# Patient Record
Sex: Female | Born: 1970 | Race: White | Hispanic: No | Smoking: Former smoker
Health system: Southern US, Community
[De-identification: ages and names within clinical notes are randomized; demographics above are authoritative.]

## PROBLEM LIST (undated history)

## (undated) DIAGNOSIS — F329 Major depressive disorder, single episode, unspecified: Secondary | ICD-10-CM

## (undated) DIAGNOSIS — F32A Depression, unspecified: Secondary | ICD-10-CM

## (undated) DIAGNOSIS — IMO0002 Reserved for concepts with insufficient information to code with codable children: Secondary | ICD-10-CM

## (undated) DIAGNOSIS — F419 Anxiety disorder, unspecified: Secondary | ICD-10-CM

## (undated) DIAGNOSIS — R519 Headache, unspecified: Secondary | ICD-10-CM

## (undated) DIAGNOSIS — R87619 Unspecified abnormal cytological findings in specimens from cervix uteri: Secondary | ICD-10-CM

## (undated) DIAGNOSIS — Q512 Other doubling of uterus, unspecified: Secondary | ICD-10-CM

## (undated) DIAGNOSIS — R51 Headache: Secondary | ICD-10-CM

## (undated) HISTORY — PX: OTHER SURGICAL HISTORY: SHX169

## (undated) HISTORY — PX: HIP SURGERY: SHX245

## (undated) HISTORY — DX: Other doubling of uterus, unspecified: Q51.20

## (undated) HISTORY — PX: ABDOMINAL SURGERY: SHX537

## (undated) HISTORY — PX: APPENDECTOMY: SHX54

## (undated) HISTORY — PX: CRYO INTERCOSTAL NERVE BLOCK: SHX6522

## (undated) HISTORY — PX: CRYOABLATION: SHX1415

## (undated) HISTORY — DX: Unspecified abnormal cytological findings in specimens from cervix uteri: R87.619

## (undated) HISTORY — PX: TUBAL LIGATION: SHX77

## (undated) HISTORY — PX: BREAST ENHANCEMENT SURGERY: SHX7

---

## 1998-01-20 HISTORY — PX: AUGMENTATION MAMMAPLASTY: SUR837

## 2009-06-28 ENCOUNTER — Ambulatory Visit: Payer: Self-pay

## 2010-09-04 ENCOUNTER — Ambulatory Visit: Payer: Self-pay

## 2014-12-11 ENCOUNTER — Other Ambulatory Visit: Payer: Self-pay

## 2014-12-11 ENCOUNTER — Encounter: Payer: Self-pay | Admitting: *Deleted

## 2014-12-11 NOTE — Patient Instructions (Signed)
  Your procedure is scheduled on: 12-22-14 ( Friday) Report to [facility] 2ND FLOOR. To find out your arrival time please call [phone] between 1PM - 3PM on 12-21-14 [facility](THURSDAY)  Remember: Instructions that are not followed completely may result in serious medical risk, up to and including death, or upon the discretion of your surgeon and anesthesiologist your surgery may need to be rescheduled.    _X___ 1. Do not eat food or drink liquids after midnight. No gum chewing or hard candies.     _X___ 2. No Alcohol for 24 hours before or after surgery.   ____ 3. Bring all medications with you on the day of surgery if instructed.    _X___ 4. Notify your doctor if there is any change in your medical condition     (cold, fever, infections).     Do not wear jewelry, make-up, hairpins, clips or nail polish.  Do not wear lotions, powders, or perfumes. You may wear deodorant.  Do not shave 48 hours prior to surgery. Men may shave face and neck.  Do not bring valuables to the hospital.    [facility] any belongings or valuables.               Contacts, dentures or bridgework may not be worn into surgery.  Leave your suitcase in the car. After surgery it may be brought to your room.  For patients admitted to the hospital, discharge time is determined by your treatment team.   Patients discharged the day of surgery will not be allowed to drive home.   Please read over the following fact sheets that you were given:      ____ Take these medicines the morning of surgery with A SIP OF WATER:    1. NONE  2.   3.   4.  5.  6.  ____ Fleet Enema (as directed)   ____ Use CHG Soap as directed  ____ Use inhalers on the day of surgery  ____ Stop metformin 2 days prior to surgery    ____ Take 1/2 of usual insulin dose the night before surgery and none on the morning of surgery.   ____ Stop Coumadin/Plavix/aspirin-[name]  ____ Stop  Anti-inflammatories-NO NSAIDS OR ASA PRODUCTS-TYLENOL OK   ____ Stop supplements until after surgery.    ____ Bring C-Pap to the hospital.

## 2014-12-12 ENCOUNTER — Inpatient Hospital Stay: Admission: RE | Admit: 2014-12-12 | Payer: Self-pay | Source: Ambulatory Visit

## 2014-12-13 ENCOUNTER — Encounter
Admission: RE | Admit: 2014-12-13 | Discharge: 2014-12-13 | Disposition: A | Discharge status: Home / Self Care | Payer: BLUE CROSS/BLUE SHIELD | Source: Ambulatory Visit | Attending: Anesthesiology | Admitting: Anesthesiology

## 2014-12-13 DIAGNOSIS — Z01812 Encounter for preprocedural laboratory examination: Secondary | ICD-10-CM | POA: Diagnosis present

## 2014-12-13 LAB — POTASSIUM: POTASSIUM: 4.8 mmol/L (ref 3.5–5.1)

## 2014-12-21 ENCOUNTER — Encounter: Payer: Self-pay | Admitting: *Deleted

## 2014-12-22 ENCOUNTER — Encounter: Payer: Self-pay | Admitting: *Deleted

## 2014-12-22 ENCOUNTER — Ambulatory Visit
Admission: RE | Admit: 2014-12-22 | Discharge: 2014-12-22 | Disposition: A | Discharge status: Home / Self Care | Payer: BLUE CROSS/BLUE SHIELD | Source: Ambulatory Visit | Attending: Obstetrics and Gynecology | Admitting: Obstetrics and Gynecology

## 2014-12-22 ENCOUNTER — Encounter
Admission: RE | Disposition: A | Discharge status: Home / Self Care | Payer: Self-pay | Source: Ambulatory Visit | Attending: Obstetrics and Gynecology

## 2014-12-22 ENCOUNTER — Ambulatory Visit: Payer: BLUE CROSS/BLUE SHIELD | Admitting: Certified Registered Nurse Anesthetist

## 2014-12-22 DIAGNOSIS — Z793 Long term (current) use of hormonal contraceptives: Secondary | ICD-10-CM | POA: Diagnosis not present

## 2014-12-22 DIAGNOSIS — F419 Anxiety disorder, unspecified: Secondary | ICD-10-CM | POA: Insufficient documentation

## 2014-12-22 DIAGNOSIS — Z87891 Personal history of nicotine dependence: Secondary | ICD-10-CM | POA: Insufficient documentation

## 2014-12-22 DIAGNOSIS — N841 Polyp of cervix uteri: Secondary | ICD-10-CM | POA: Insufficient documentation

## 2014-12-22 DIAGNOSIS — F329 Major depressive disorder, single episode, unspecified: Secondary | ICD-10-CM | POA: Insufficient documentation

## 2014-12-22 DIAGNOSIS — Z96641 Presence of right artificial hip joint: Secondary | ICD-10-CM | POA: Diagnosis not present

## 2014-12-22 DIAGNOSIS — N854 Malposition of uterus: Secondary | ICD-10-CM | POA: Diagnosis not present

## 2014-12-22 DIAGNOSIS — Z9049 Acquired absence of other specified parts of digestive tract: Secondary | ICD-10-CM | POA: Diagnosis not present

## 2014-12-22 DIAGNOSIS — Q5128 Other doubling of uterus, other specified: Secondary | ICD-10-CM

## 2014-12-22 DIAGNOSIS — Q512 Other doubling of uterus: Secondary | ICD-10-CM | POA: Insufficient documentation

## 2014-12-22 DIAGNOSIS — Z79899 Other long term (current) drug therapy: Secondary | ICD-10-CM | POA: Insufficient documentation

## 2014-12-22 DIAGNOSIS — N920 Excessive and frequent menstruation with regular cycle: Secondary | ICD-10-CM | POA: Insufficient documentation

## 2014-12-22 DIAGNOSIS — Z9851 Tubal ligation status: Secondary | ICD-10-CM | POA: Insufficient documentation

## 2014-12-22 HISTORY — DX: Headache, unspecified: R51.9

## 2014-12-22 HISTORY — DX: Other and unspecified doubling of uterus: Q51.28

## 2014-12-22 HISTORY — DX: Anxiety disorder, unspecified: F41.9

## 2014-12-22 HISTORY — DX: Headache: R51

## 2014-12-22 HISTORY — PX: HYSTEROSCOPY WITH D & C: SHX1775

## 2014-12-22 HISTORY — DX: Major depressive disorder, single episode, unspecified: F32.9

## 2014-12-22 HISTORY — DX: Depression, unspecified: F32.A

## 2014-12-22 HISTORY — DX: Reserved for concepts with insufficient information to code with codable children: IMO0002

## 2014-12-22 LAB — POCT PREGNANCY, URINE: Preg Test, Ur: NEGATIVE

## 2014-12-22 SURGERY — DILATATION AND CURETTAGE /HYSTEROSCOPY
Anesthesia: General | Wound class: Clean Contaminated

## 2014-12-22 MED ORDER — ONDANSETRON HCL 4 MG/2ML IJ SOLN
INTRAMUSCULAR | Status: DC | PRN
  Administered 2014-12-22: 4 mg via INTRAVENOUS

## 2014-12-22 MED ORDER — FAMOTIDINE 20 MG PO TABS
20.0000 mg | ORAL_TABLET | Freq: Once | ORAL | Status: AC
  Administered 2014-12-22: 20 mg via ORAL

## 2014-12-22 MED ORDER — MIDAZOLAM HCL 2 MG/2ML IJ SOLN
INTRAMUSCULAR | Status: DC | PRN
  Administered 2014-12-22: 2 mg via INTRAVENOUS

## 2014-12-22 MED ORDER — PROPOFOL 10 MG/ML IV BOLUS
INTRAVENOUS | Status: DC | PRN
  Administered 2014-12-22: 150 mg via INTRAVENOUS

## 2014-12-22 MED ORDER — DEXAMETHASONE SODIUM PHOSPHATE 4 MG/ML IJ SOLN
INTRAMUSCULAR | Status: DC | PRN
  Administered 2014-12-22: 5 mg via INTRAVENOUS

## 2014-12-22 MED ORDER — FENTANYL CITRATE (PF) 100 MCG/2ML IJ SOLN
INTRAMUSCULAR | Status: DC | PRN
  Administered 2014-12-22 (×2): 50 ug via INTRAVENOUS

## 2014-12-22 MED ORDER — FENTANYL CITRATE (PF) 100 MCG/2ML IJ SOLN
25.0000 ug | INTRAMUSCULAR | Status: AC | PRN
  Administered 2014-12-22 (×6): 25 ug via INTRAVENOUS

## 2014-12-22 MED ORDER — HYDROCODONE-ACETAMINOPHEN 5-325 MG PO TABS: 1.0000 | ORAL_TABLET | Freq: Four times a day (QID) | ORAL | Status: DC | PRN

## 2014-12-22 MED ORDER — ACETAMINOPHEN 10 MG/ML IV SOLN
INTRAVENOUS | Status: DC | PRN
  Administered 2014-12-22: 1000 mg via INTRAVENOUS

## 2014-12-22 MED ORDER — FAMOTIDINE 20 MG PO TABS
ORAL_TABLET | ORAL | Status: AC
  Filled 2014-12-22: qty 1

## 2014-12-22 MED ORDER — OXYCODONE HCL 5 MG PO TABS
ORAL_TABLET | ORAL | Status: AC
  Filled 2014-12-22: qty 1

## 2014-12-22 MED ORDER — OXYCODONE HCL 5 MG/5ML PO SOLN: 5.0000 mg | Freq: Once | ORAL | Status: AC | PRN

## 2014-12-22 MED ORDER — FENTANYL CITRATE (PF) 100 MCG/2ML IJ SOLN
INTRAMUSCULAR | Status: AC
  Administered 2014-12-22: 25 ug via INTRAVENOUS
  Filled 2014-12-22: qty 2

## 2014-12-22 MED ORDER — ACETAMINOPHEN 10 MG/ML IV SOLN
INTRAVENOUS | Status: AC
  Filled 2014-12-22: qty 100

## 2014-12-22 MED ORDER — LIDOCAINE HCL (CARDIAC) 20 MG/ML IV SOLN
INTRAVENOUS | Status: DC | PRN
  Administered 2014-12-22: 100 mg via INTRAVENOUS

## 2014-12-22 MED ORDER — IBUPROFEN 600 MG PO TABS: 600.0000 mg | ORAL_TABLET | Freq: Four times a day (QID) | ORAL | Status: DC | PRN

## 2014-12-22 MED ORDER — LACTATED RINGERS IV SOLN
INTRAVENOUS | Status: DC
  Administered 2014-12-22: 14:00:00 via INTRAVENOUS

## 2014-12-22 MED ORDER — SODIUM CHLORIDE 0.9 % IR SOLN
Status: DC | PRN
  Administered 2014-12-22: 350 mL

## 2014-12-22 MED ORDER — OXYCODONE HCL 5 MG PO TABS
5.0000 mg | ORAL_TABLET | Freq: Once | ORAL | Status: AC | PRN
  Administered 2014-12-22: 5 mg via ORAL

## 2014-12-22 SURGICAL SUPPLY — 21 items
ABLATOR ENDOMETRIAL MYOSURE (ABLATOR) ×3 IMPLANT
CANISTER SUC SOCK COL 7IN (MISCELLANEOUS) ×3 IMPLANT
CATH ROBINSON RED A/P 16FR (CATHETERS) ×3 IMPLANT
GLOVE BIO SURGEON STRL SZ7 (GLOVE) ×12 IMPLANT
GOWN STRL REUS W/ TWL LRG LVL3 (GOWN DISPOSABLE) ×4 IMPLANT
GOWN STRL REUS W/TWL LRG LVL3 (GOWN DISPOSABLE) ×2
IV LACTATED RINGERS 1000ML (IV SOLUTION) ×3 IMPLANT
KIT RM TURNOVER CYSTO AR (KITS) ×3 IMPLANT
MYOSURE LITE POLYP REMOVAL (MISCELLANEOUS) IMPLANT
NOVASURE ENDOMETRIAL ABLATION (MISCELLANEOUS) ×3 IMPLANT
NS IRRIG 500ML POUR BTL (IV SOLUTION) ×3 IMPLANT
PACK DNC HYST (MISCELLANEOUS) ×3 IMPLANT
PAD GROUND ADULT SPLIT (MISCELLANEOUS) ×3 IMPLANT
PAD OB MATERNITY 4.3X12.25 (PERSONAL CARE ITEMS) ×3 IMPLANT
PAD PREP 24X41 OB/GYN DISP (PERSONAL CARE ITEMS) ×3 IMPLANT
SEAL ROD LENS SCOPE MYOSURE (ABLATOR) ×3 IMPLANT
SOL .9 NS 3000ML IRR  AL (IV SOLUTION) ×2
SOL .9 NS 3000ML IRR UROMATIC (IV SOLUTION) ×4 IMPLANT
TOWEL OR 17X26 4PK STRL BLUE (TOWEL DISPOSABLE) ×3 IMPLANT
TUBING CONNECTING 10 (TUBING) ×3 IMPLANT
TUBING HYSTEROSCOPY DOLPHIN (MISCELLANEOUS) ×3 IMPLANT

## 2014-12-22 NOTE — Op Note (Signed)
Patient Name: Brenda Stuart Date of Procedure: @TODAY @  Preoperative Diagnosis: 1) 44 y.o. with menorrhagia 2) Cervical polyp  Postoperative Diagnosis: 1) 44 y.o. with menorrhagia 2) Cervical polyp 3) Uterine septum  Operation Performed: Hysteroscopy, cervical polypectomy  Indication: Patient over 40 on combination OCP for menorrhagia, prior BTL, wanting to discontinue combination OCP and removal of cervical polyp.  Preoperative endometrial biopsy normal and normal pap.  Anesthesia: General  Primary Surgeon: Vena AustriaAndreas Janziel Hockett, MD  Assistant: none  Preoperative Antibiotics: none  Estimated Blood Loss: 5mL  IV Fluids: 600mL  Urine Output:: ~2925mL straiggt cath  Drains or Tubes: none  Implants: none  Specimens Removed: endocervical curetting and cervical polyp  Complications: none  Intraoperative Findings:  Cervical polyp, uterine septum precluding novasure ablation  Patient Condition: stable  Procedure in Detail:  Patient was taken to the operating room were she was administered general endotracheal anesthesia.  She was positioned in the dorsal lithotomy position utilizing Allen stirups, prepped and draped in the usual sterile fashion.  Uterus was noted to be anteverted and normal in size.   Prior to proceeding with the case a time out was performed.  Attention was turned to the patient's pelvis.  A red rubber catheter was used to empty the patient's bladder.  An operative speculum was placed to allow visualization of the cervix.  The anterior lip of the cervix was grasped with a single tooth tenaculum and the cervix was sequentially dilated using pratt dilators, sounding length was 8cm.  The hysteroscope was then advanced into the uterine cavity noting the above findings.  Given finding of uterine septum novasure endometrial ablation was unable to be performed.  Endocervical curettage was performed and the resulting specimen collected and sent to pathology.    The  single tooth tenaculum was removed from the cervix.  The tenaculum sites and cervix were noted to be hemostatic before removing the operative speculum.  Sponge needle and instrument counts were corrects times two.  The patient tolerated the procedure well and was taken to the recovery room in stable condition.

## 2014-12-22 NOTE — H&P (Signed)
Initial H&P reviewed   History reviewed, patient examined, no change in status, stable for surgery.  

## 2014-12-22 NOTE — Anesthesia Preprocedure Evaluation (Signed)
Anesthesia Evaluation  Patient identified by MRN, date of birth, ID band Patient awake    Reviewed: Allergy & Precautions, H&P , NPO status , Patient's Chart, lab work & pertinent test results  History of Anesthesia Complications Negative for: history of anesthetic complications  Airway Mallampati: III  TM Distance: >3 FB Neck ROM: full    Dental no notable dental hx. (+) Teeth Intact   Pulmonary neg shortness of breath, former smoker,    Pulmonary exam normal breath sounds clear to auscultation       Cardiovascular Exercise Tolerance: Good (-) angina(-) Past MI and (-) DOE negative cardio ROS Normal cardiovascular exam Rhythm:regular Rate:Normal     Neuro/Psych  Headaches, PSYCHIATRIC DISORDERS Anxiety Depression  Neuromuscular disease    GI/Hepatic negative GI ROS, Neg liver ROS,   Endo/Other  negative endocrine ROS  Renal/GU negative Renal ROS  negative genitourinary   Musculoskeletal   Abdominal   Peds  Hematology negative hematology ROS (+)   Anesthesia Other Findings Past Medical History:   Complex regional pain syndrome                               Headache                                                       Comment:MIGRAINES    Anxiety                                                      Depression                                                  Past Surgical History:   ABDOMINAL SURGERY                                             APPENDECTOMY                                                  BREAST ENHANCEMENT SURGERY                                    CESAREAN SECTION                                              CRYOABLATION  HIP SURGERY                                                   TUBAL LIGATION                                               BMI    Body Mass Index   20.37 kg/m 2      Reproductive/Obstetrics negative OB ROS                              Anesthesia Physical Anesthesia Plan  ASA: III  Anesthesia Plan: General LMA   Post-op Pain Management:    Induction:   Airway Management Planned:   Additional Equipment:   Intra-op Plan:   Post-operative Plan:   Informed Consent: I have reviewed the patients History and Physical, chart, labs and discussed the procedure including the risks, benefits and alternatives for the proposed anesthesia with the patient or authorized representative who has indicated his/her understanding and acceptance.   Dental Advisory Given  Plan Discussed with: Anesthesiologist, CRNA and Surgeon  Anesthesia Plan Comments:         Anesthesia Quick Evaluation

## 2014-12-22 NOTE — Anesthesia Postprocedure Evaluation (Signed)
Anesthesia Post Note  Patient: Brenda Stuart  Procedure(s) Performed: Procedure(s): DILATATION AND CURETTAGE /HYSTEROSCOPY with cervical polypectomy  Patient location during evaluation: PACU Anesthesia Type: General Level of consciousness: awake and alert Pain management: pain level controlled Vital Signs Assessment: post-procedure vital signs reviewed and stable Respiratory status: spontaneous breathing, nonlabored ventilation, respiratory function stable and patient connected to nasal cannula oxygen Cardiovascular status: blood pressure returned to baseline and stable Postop Assessment: no signs of nausea or vomiting Anesthetic complications: no    Last Vitals:  Filed Vitals:   12/22/14 1557 12/22/14 1646  BP: 111/70 114/78  Pulse: 72 72  Temp: 36.8 C 36.7 C  Resp: 14 16    Last Pain:  Filed Vitals:   12/22/14 1647  PainSc: 4                  Jomarie LongsJoseph K Piscitello

## 2014-12-22 NOTE — Transfer of Care (Signed)
Immediate Anesthesia Transfer of Care Note  Patient: Brenda Stuart  Procedure(s) Performed: Procedure(s): DILATATION AND CURETTAGE /HYSTEROSCOPY  Patient Location: PACU  Anesthesia Type:General  Level of Consciousness: awake and alert   Airway & Oxygen Therapy: Patient Spontanous Breathing and Patient connected to nasal cannula oxygen  Post-op Assessment: Report given to RN and Post -op Vital signs reviewed and stable  Post vital signs: Reviewed and stable  Last Vitals:  Filed Vitals:   12/22/14 1304 12/22/14 1502  BP: 108/74 124/85  Pulse: 71 81  Temp: 36.5 C 36.7 C  Resp: 16 20    Complications: No apparent anesthesia complications

## 2014-12-22 NOTE — Anesthesia Procedure Notes (Signed)
Procedure Name: LMA Insertion Date/Time: 12/22/2014 2:17 PM Performed by: Ginger CarneMICHELET, Darina Hartwell Pre-anesthesia Checklist: Patient identified, Emergency Drugs available, Suction available, Patient being monitored and Timeout performed Patient Re-evaluated:Patient Re-evaluated prior to inductionOxygen Delivery Method: Circle system utilized Preoxygenation: Pre-oxygenation with 100% oxygen Intubation Type: IV induction LMA: LMA inserted LMA Size: 3.5 Number of attempts: 1 Placement Confirmation: positive ETCO2 Tube secured with: Tape Dental Injury: Teeth and Oropharynx as per pre-operative assessment

## 2014-12-25 ENCOUNTER — Encounter: Payer: Self-pay | Admitting: Obstetrics and Gynecology

## 2014-12-26 LAB — SURGICAL PATHOLOGY

## 2016-01-17 ENCOUNTER — Other Ambulatory Visit: Payer: Self-pay | Admitting: Obstetrics and Gynecology

## 2016-01-17 DIAGNOSIS — Z1231 Encounter for screening mammogram for malignant neoplasm of breast: Secondary | ICD-10-CM

## 2016-01-18 ENCOUNTER — Ambulatory Visit
Admission: RE | Admit: 2016-01-18 | Discharge: 2016-01-18 | Disposition: A | Discharge status: Home / Self Care | Payer: BLUE CROSS/BLUE SHIELD | Source: Ambulatory Visit | Attending: Obstetrics and Gynecology | Admitting: Obstetrics and Gynecology

## 2016-01-18 ENCOUNTER — Encounter: Payer: Self-pay | Admitting: Radiology

## 2016-01-18 DIAGNOSIS — Z1231 Encounter for screening mammogram for malignant neoplasm of breast: Secondary | ICD-10-CM

## 2016-01-28 ENCOUNTER — Inpatient Hospital Stay
Admission: RE | Admit: 2016-01-28 | Discharge: 2016-01-28 | Disposition: A | Discharge status: Home / Self Care | Payer: Self-pay | Source: Ambulatory Visit | Attending: *Deleted | Admitting: *Deleted

## 2016-01-28 ENCOUNTER — Other Ambulatory Visit: Payer: Self-pay | Admitting: *Deleted

## 2016-01-28 DIAGNOSIS — Z9289 Personal history of other medical treatment: Secondary | ICD-10-CM

## 2016-03-25 ENCOUNTER — Ambulatory Visit: Payer: Self-pay | Admitting: Obstetrics and Gynecology

## 2016-03-27 ENCOUNTER — Other Ambulatory Visit: Payer: Self-pay | Admitting: Obstetrics and Gynecology

## 2016-03-27 NOTE — Telephone Encounter (Signed)
PT aware Rx refilled until her scheduled annula appointment with CLG on 2/28

## 2016-04-15 ENCOUNTER — Telehealth: Payer: Self-pay | Admitting: Obstetrics and Gynecology

## 2016-04-15 NOTE — Telephone Encounter (Signed)
Pt was schedule with Colleen for 04/16/16 and we had to reschedule her appointment out to May 16. With AMS due to many overbookings. Pt will need an Refill

## 2016-04-16 ENCOUNTER — Ambulatory Visit: Payer: Self-pay | Admitting: Certified Nurse Midwife

## 2016-04-16 NOTE — Telephone Encounter (Signed)
What medicine does she need a refill for?

## 2016-04-16 NOTE — Telephone Encounter (Signed)
Please refill.

## 2016-04-17 NOTE — Telephone Encounter (Signed)
msg left for pt to find out what medication she needs

## 2016-04-28 ENCOUNTER — Other Ambulatory Visit: Payer: Self-pay | Admitting: Obstetrics and Gynecology

## 2016-04-29 NOTE — Telephone Encounter (Signed)
AMS prescribed Brenda Stuart on 4/9.

## 2016-05-24 ENCOUNTER — Other Ambulatory Visit: Payer: Self-pay | Admitting: Obstetrics and Gynecology

## 2016-05-29 ENCOUNTER — Encounter: Payer: Self-pay | Admitting: Obstetrics and Gynecology

## 2016-06-04 ENCOUNTER — Ambulatory Visit (INDEPENDENT_AMBULATORY_CARE_PROVIDER_SITE_OTHER): Payer: BLUE CROSS/BLUE SHIELD | Admitting: Obstetrics and Gynecology

## 2016-06-04 ENCOUNTER — Encounter: Payer: Self-pay | Admitting: Obstetrics and Gynecology

## 2016-06-04 VITALS — BP 112/66 | HR 93 | Ht 68.0 in | Wt 130.0 lb

## 2016-06-04 DIAGNOSIS — Z01419 Encounter for gynecological examination (general) (routine) without abnormal findings: Secondary | ICD-10-CM | POA: Diagnosis not present

## 2016-06-04 DIAGNOSIS — Z124 Encounter for screening for malignant neoplasm of cervix: Secondary | ICD-10-CM

## 2016-06-04 MED ORDER — NORETHINDRONE 0.35 MG PO TABS: 1.0000 | ORAL_TABLET | Freq: Every day | ORAL | 11 refills | Status: DC

## 2016-06-04 NOTE — Progress Notes (Signed)
Patient ID: Brenda Stuart, female   DOB: May 25, 1970, 46 y.o.   MRN: [id number]     Gynecology Annual Exam  [place], [facility]  Chief Complaint:  Chief Complaint  Patient presents with  . Gynecologic Exam    refill on birthcontrol    History of Present Illness: Patient is a 46 y.o. [id number] presents for annual exam. The patient has no complaints today.   LMP: No LMP recorded. Patient is not currently having periods (Reason: Oral contraceptives).  Menstrual cycles have been light well controlled on micronor.  No vasomotor symptoms   The patient is sexually active. She currently uses progestin only pill for contraception. She denies dyspareunia.  The patient does perform self breast exams.  There is no notable family history of breast or ovarian cancer in her family.  The patient wears seatbelts: yes.   The patient has regular exercise: yes.    The patient denies current symptoms of depression.    Review of Systems: Review of Systems  Constitutional: Negative for chills and fever.  HENT: Negative for congestion.   Respiratory: Negative for cough and shortness of breath.   Cardiovascular: Negative for chest pain and palpitations.  Gastrointestinal: Negative for abdominal pain, constipation, diarrhea, heartburn, nausea and vomiting.  Genitourinary: Negative for dysuria, frequency and urgency.  Skin: Negative for itching and rash.  Neurological: Negative for dizziness and headaches.  Endo/Heme/Allergies: Negative for polydipsia.  Psychiatric/Behavioral: Negative for depression.    Past Medical History:  Past Medical History:  Diagnosis Date  . Abnormal Pap smear of cervix   . Anxiety   . Complex regional pain syndrome   . Depression   . Headache    MIGRAINES   . 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 ABDOMINAL SURGERY    . APPENDECTOMY    . AUGMENTATION MAMMAPLASTY Bilateral     . BREAST ENHANCEMENT SURGERY    . CESAREAN SECTION    . CRYOABLATION    . HIP SURGERY    . HIP SURGERY  12/2013, 12/2014  . HYSTEROSCOPY W/D&C  12/22/2014   Procedure: DILATATION AND CURETTAGE /HYSTEROSCOPY with cervical polypectomy;  Surgeon: Vena AustriaAndreas Anastashia Westerfeld, MD;  Location: [facility] ORS;  Service: Gynecology;;  . TUBAL LIGATION      Gynecologic History:  No LMP recorded. Patient is not currently having periods (Reason: Oral contraceptives). Contraception: camila Last Pap: Results were: NIL and HR HPV negative 12/01/2014 Last mammogram: 01/18/2016 Results were: BI-RAD I Obstetric History: [id number]: G3P2012  Family History:  Family History  Problem Relation Age of Onset  . Diabetes Mother   . Hypertension Mother   . Hypertension Father     Social History:  Social History   Social History  . Marital status: Married    Spouse name: [name]  . Number of children: [name]  . Years of education: [name]   Occupational History  . Not on file.   Social History Main Topics  . Smoking status: Former Smoker    Packs/day: 1.00    Types: Cigarettes  . Smokeless tobacco: Never Used  . Alcohol use No  . Drug use: No  . Sexual activity: Not Currently    Birth control/ protection: Pill   Other Topics Concern  . Not on file   Social History Narrative  . No narrative on file    Allergies:  Allergies  Allergen Reactions  . Lyrica [Pregabalin] Hives    Medications: Prior to  Admission medications   Medication Sig Start Date End Date Taking? Authorizing Provider  CAMILA 0.35 MG tablet TAKE 1 TABLET BY MOUTH EVERY DAY 05/26/16  Yes Vena Austria, MD  gabapentin (NEURONTIN) 800 MG tablet Take 1,800 mg by mouth daily.   Yes [provider]  oxyCODONE (OXYCONTIN) 10 mg 12 hr tablet Take 10 mg by mouth every 12 (twelve) hours.   Yes [provider]  sertraline (ZOLOFT) 25 MG tablet Take 25 mg by mouth daily.   Yes [provider]  spironolactone (ALDACTONE) 100 MG tablet  Take 100 mg by mouth daily.   Yes [provider]  valACYclovir (VALTREX) 500 MG tablet Take 500 mg by mouth 2 (two) times daily.   Yes [provider]    Physical Exam Vitals: Blood pressure 112/66, pulse 93, height 5\' 8"  (1.727 m), weight 130 lb (59 kg).  General: NAD HEENT: normocephalic, anicteric Thyroid: no enlargement, no palpable nodules Pulmonary: No increased work of breathing, CTAB Cardiovascular: RRR, distal pulses 2+ Breast: Breast symmetrical, no tenderness, no palpable nodules or masses, no skin or nipple retraction present, no nipple discharge.  No axillary or supraclavicular lymphadenopathy. Abdomen: NABS, soft, non-tender, non-distended.  Umbilicus without lesions.  No hepatomegaly, splenomegaly or masses palpable. No evidence of hernia  Genitourinary:  External: Normal external female genitalia.  Normal urethral meatus, normal  Bartholin's and Skene's glands.    Vagina: Normal vaginal mucosa, no evidence of prolapse.    Cervix: Grossly normal in appearance, no bleeding  Uterus: Non-enlarged, mobile, normal contour.  No CMT  Adnexa: ovaries non-enlarged, no adnexal masses  Rectal: deferred  Lymphatic: no evidence of inguinal lymphadenopathy Extremities: no edema, erythema, or tenderness Neurologic: Grossly intact Psychiatric: mood appropriate, affect full  Female chaperone present for pelvic and breast  portions of the physical exam    Assessment: 46 y.o. [id number] No problem-specific Assessment & Plan notes found for this encounter.   Plan: Problem List Items Addressed This Visit    None    Visit Diagnoses    Encounter for gynecological examination without abnormal finding    -  Primary   Relevant Orders   PapIG, HPV, rfx 16/18   Cervical cancer screening       Relevant Orders   PapIG, HPV, rfx 16/18      1) Mammogram - recommend yearly screening mammogram.  Mammogram Is up to date  2) STI screening was offered and declined  3)  ASCCP guidelines and rational discussed.  Patient opts for yearly screening interval last 2016  4) Contraception - Education given regarding options for contraception, including NuvaRing, Nexplanon, Surgical Sterilization including vasectomy.  5) Routine healthcare maintenance including cholesterol, diabetes screening discussed Declines

## 2016-06-04 NOTE — Patient Instructions (Signed)

## 2016-06-07 LAB — PAPIG, HPV, RFX 16/18
HPV, high-risk: NEGATIVE
PAP Smear Comment: 0

## 2017-01-16 ENCOUNTER — Telehealth: Payer: Self-pay

## 2017-01-16 NOTE — Telephone Encounter (Signed)
Melasma can be caused by OCPs but I would feel more comfortable if Dr Bonney AidStaebler chooses whether to consult with her about changing birth control or to do a dermatology consult to treat the symptom.  Thanks, Bruce BingJaci

## 2017-01-16 NOTE — Telephone Encounter (Signed)
AMS please advise 

## 2017-01-16 NOTE — Telephone Encounter (Signed)
Pt saw AMS 05/2016 & her OCP was changed to a lower dose. She has a question about some possible side effects. [id number]#[phone]#[phone]

## 2017-01-16 NOTE — Telephone Encounter (Signed)
AMS switched her to a low dose ocp due to her age.Pt states she has noticed brown spots on her face and the seem to be getting darker. She wasn't sure if this was due to the low dose ocp. Pt aware AMS is out of the office. Jacelyn please advise.

## 2017-01-18 NOTE — Telephone Encounter (Signed)
Can be caused by pregnancy or any birth control.  All birth control pills may cause this condition, the current pill she is on is the lowest dose pill available so switching to another pill won't improve symptoms.  The areas of darkened pigmentation are usually seen on the cheek and forehead, and should resolve with cessation of the birth control pill. Discontinuation of the pill is really the only option to get them to fade away.  The biggest question will be what that does to cycle control.  Since she has the previously diagnosed uterine septum we can't do an ablation, we could try and IUD but I have concerns about how it would fit with the septum.  If we go off the pill and have heavy menstrual cycles the only option would be hysterectomy.  There are some skin bleaching creams but they do not appear to work well or have great cosmetic results.

## 2017-01-19 NOTE — Telephone Encounter (Signed)
AMS called pt on 12/31

## 2017-05-30 ENCOUNTER — Other Ambulatory Visit: Payer: Self-pay | Admitting: Obstetrics and Gynecology

## 2017-08-24 ENCOUNTER — Other Ambulatory Visit: Payer: Self-pay | Admitting: Obstetrics and Gynecology

## 2017-11-09 ENCOUNTER — Ambulatory Visit (INDEPENDENT_AMBULATORY_CARE_PROVIDER_SITE_OTHER): Payer: BLUE CROSS/BLUE SHIELD | Admitting: Obstetrics and Gynecology

## 2017-11-09 ENCOUNTER — Other Ambulatory Visit: Payer: Self-pay | Admitting: Obstetrics and Gynecology

## 2017-11-09 ENCOUNTER — Encounter: Payer: Self-pay | Admitting: Obstetrics and Gynecology

## 2017-11-09 ENCOUNTER — Other Ambulatory Visit (HOSPITAL_COMMUNITY)
Admission: RE | Admit: 2017-11-09 | Discharge: 2017-11-09 | Disposition: A | Discharge status: Home / Self Care | Payer: BLUE CROSS/BLUE SHIELD | Source: Ambulatory Visit | Attending: Obstetrics and Gynecology | Admitting: Obstetrics and Gynecology

## 2017-11-09 VITALS — BP 112/72 | HR 79 | Ht 68.0 in | Wt 141.0 lb

## 2017-11-09 DIAGNOSIS — Z113 Encounter for screening for infections with a predominantly sexual mode of transmission: Secondary | ICD-10-CM | POA: Insufficient documentation

## 2017-11-09 DIAGNOSIS — Z1239 Encounter for other screening for malignant neoplasm of breast: Secondary | ICD-10-CM | POA: Diagnosis not present

## 2017-11-09 DIAGNOSIS — Z01419 Encounter for gynecological examination (general) (routine) without abnormal findings: Secondary | ICD-10-CM

## 2017-11-09 DIAGNOSIS — Z124 Encounter for screening for malignant neoplasm of cervix: Secondary | ICD-10-CM

## 2017-11-09 DIAGNOSIS — Z23 Encounter for immunization: Secondary | ICD-10-CM

## 2017-11-09 DIAGNOSIS — Z1231 Encounter for screening mammogram for malignant neoplasm of breast: Secondary | ICD-10-CM

## 2017-11-09 MED ORDER — NORETHINDRONE 0.35 MG PO TABS: 1.0000 | ORAL_TABLET | Freq: Every day | ORAL | 3 refills | Status: DC

## 2017-11-09 NOTE — Patient Instructions (Signed)
[facility] [address] [place] [address]  [facility] [place]  [address] [place] [address]  Phone: [phone]  

## 2017-11-09 NOTE — Progress Notes (Signed)
Gynecology Annual Exam  PCP: [facility]  Chief Complaint:  Chief Complaint  Patient presents with  . Gynecologic Exam  . Immunizations    Flu vaccine    History of Present Illness: Patient is a 47 y.o. [id number] presents for annual exam. The patient has no complaints today. Is scheduled to undergo hip replacement in the next month.    LMP: No LMP recorded. (Menstrual status: Oral contraceptives). No menses on norethindrone  The patient is sexually active. She currently uses oral progesterone-only contraceptive for contraception. She admits to dyspareunia.  The patient does perform self breast exams.  There is no notable family history of breast or ovarian cancer in her family.  The patient wears seatbelts: yes.   The patient has regular exercise: not asked.    The patient denies current symptoms of depression.    Review of Systems: Review of Systems  Constitutional: Negative for chills and fever.  HENT: Negative for congestion.   Eyes: Negative.   Respiratory: Negative for cough and shortness of breath.   Cardiovascular: Negative for chest pain and palpitations.  Gastrointestinal: Negative for abdominal pain, constipation, diarrhea, heartburn, nausea and vomiting.  Genitourinary: Negative for dysuria, frequency and urgency.  Musculoskeletal: Positive for joint pain.  Skin: Negative for itching and rash.  Neurological: Positive for dizziness and headaches. Negative for tingling, tremors and sensory change.  Endo/Heme/Allergies: Negative for polydipsia.  Psychiatric/Behavioral: Positive for depression. Negative for hallucinations, substance abuse and suicidal ideas. The patient has insomnia. The patient is not nervous/anxious.     Past Medical History:  Past Medical History:  Diagnosis Date  . Abnormal Pap smear of cervix   . Anxiety   . Complex regional pain syndrome   . Depression   . Headache    MIGRAINES   . 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 ABDOMINAL SURGERY    . APPENDECTOMY    . AUGMENTATION MAMMAPLASTY Bilateral   . BREAST ENHANCEMENT SURGERY    . CESAREAN SECTION    . CRYO INTERCOSTAL NERVE BLOCK     Ganglion, Lumbar  . CRYOABLATION    . HIP SURGERY    . HIP SURGERY  12/2013, 12/2014  . HYSTEROSCOPY W/D&C  12/22/2014   Procedure: DILATATION AND CURETTAGE /HYSTEROSCOPY with cervical polypectomy;  Surgeon: Vena Austria, MD;  Location: [facility] ORS;  Service: Gynecology;;  . TUBAL LIGATION      Gynecologic History:  No LMP recorded. (Menstrual status: Oral contraceptives). Contraception: oral progesterone-only contraceptive Last Pap: Results were: 06/04/2016 NIL and HR HPV negative  12/01/2014 NIL and HR HPV negative  Last mammogram: 01/08/16 Results were: BI-RAD I  Obstetric History: [id number]  Family History:  Family History  Problem Relation Age of Onset  . Diabetes Mother   . Hypertension Mother   . Hypertension Father     Social History:  Social History   Socioeconomic History  . Marital status: Married    Spouse name: Not on file  . Number of children: Not on file  . Years of education: Not on file  . Highest education level: Not on file  Occupational History  . Not on file  Social Needs  . Financial resource strain: Not on file  . Food insecurity:    Worry: Not on file    Inability: Not on file  . Transportation needs:    Medical: Not on file    Non-medical: Not on  file  Tobacco Use  . Smoking status: Former Smoker    Packs/day: 1.00    Types: Cigarettes  . Smokeless tobacco: Never Used  Substance and Sexual Activity  . Alcohol use: Yes    Comment: Occ  . Drug use: No  . Sexual activity: Yes    Birth control/protection: Pill  Lifestyle  . Physical activity:    Days per week: Not on file    Minutes per session: Not on file  . Stress: Not on file  Relationships  . Social connections:    Talks on phone: Not  on file    Gets together: Not on file    Attends religious service: Not on file    Active member of club or organization: Not on file    Attends meetings of clubs or organizations: Not on file    Relationship status: Not on file  . Intimate partner violence:    Fear of current or ex partner: Not on file    Emotionally abused: Not on file    Physically abused: Not on file    Forced sexual activity: Not on file  Other Topics Concern  . Not on file  Social History Narrative  . Not on file    Allergies:  Allergies  Allergen Reactions  . Lyrica [Pregabalin] Hives    Medications: Prior to Admission medications   Medication Sig Start Date End Date Taking? Authorizing Provider  gabapentin (NEURONTIN) 800 MG tablet Take 1,800 mg by mouth daily.    [provider]  norethindrone (MICRONOR,CAMILA,ERRIN) 0.35 MG tablet TAKE 1 TABLET BY MOUTH EVERY DAY 08/24/17   Vena Austria, MD  oxyCODONE (OXYCONTIN) 10 mg 12 hr tablet Take 10 mg by mouth every 12 (twelve) hours.    [provider]  sertraline (ZOLOFT) 25 MG tablet Take 25 mg by mouth daily.    [provider]  spironolactone (ALDACTONE) 100 MG tablet Take 100 mg by mouth daily.    [provider]  valACYclovir (VALTREX) 500 MG tablet Take 500 mg by mouth 2 (two) times daily.    [provider]    Physical Exam Vitals: Blood pressure 112/72, pulse 79, height 5\' 8"  (1.727 m), weight 141 lb (64 kg).  General: NAD HEENT: normocephalic, anicteric Thyroid: no enlargement, no palpable nodules Pulmonary: No increased work of breathing, CTAB Cardiovascular: RRR, distal pulses 2+ Breast: Breast symmetrical, no tenderness, no palpable nodules or masses, no skin or nipple retraction present, no nipple discharge.  No axillary or supraclavicular lymphadenopathy. Abdomen: NABS, soft, non-tender, non-distended.  Umbilicus without lesions.  No hepatomegaly, splenomegaly or masses palpable. No evidence  of hernia  Genitourinary:  External: Normal external female genitalia.  Normal urethral meatus, normal Bartholin's and Skene's glands.    Vagina: Normal vaginal mucosa, no evidence of prolapse.    Cervix: Grossly normal in appearance, no bleeding  Uterus: Non-enlarged, mobile, normal contour.  No CMT  Adnexa: ovaries non-enlarged, no adnexal masses  Rectal: deferred  Lymphatic: no evidence of inguinal lymphadenopathy Extremities: no edema, erythema, or tenderness Neurologic: Grossly intact Psychiatric: mood appropriate, affect full  Female chaperone present for pelvic and breast  portions of the physical exam    Assessment: 47 y.o. [id number] routine annual exam  Plan: Problem List Items Addressed This Visit    None    Visit Diagnoses    Encounter for gynecological examination without abnormal finding    -  Primary   Screening for malignant neoplasm of cervix  Relevant Orders   Cytology - PAP (Completed)   Breast screening       Flu vaccine need       Relevant Orders   Flu Vaccine QUAD 36+ mos IM (Completed)   Routine screening for STI (sexually transmitted infection)       Relevant Orders   Cytology - PAP (Completed)   HEP, RPR, HIV Panel (Completed)      1) Mammogram - recommend yearly screening mammogram.  Mammogram Was ordered today   2) STI screening  was notoffered and therefore not obtained  3) ASCCP guidelines and rational discussed.  Patient opts for yearly screening interval  4) Contraception - the patient is currently using  oral progesterone-only contraceptive.  She is happy with her current form of contraception and plans to continue  5) Colonoscopy -- screening colonoscopy due next year  6) Routine healthcare maintenance including cholesterol, diabetes screening discussed managed by PCP  7) No follow-ups on file.   Vena Austria, MD, Ever[facility], [facility] 11/09/2017, 1:44 PM

## 2017-11-10 LAB — HEP, RPR, HIV PANEL
HIV Screen 4th Generation wRfx: NONREACTIVE
Hepatitis B Surface Ag: NEGATIVE
RPR Ser Ql: NONREACTIVE

## 2017-11-11 LAB — CYTOLOGY - PAP
Chlamydia: NEGATIVE
DIAGNOSIS: NEGATIVE
HPV (WINDOPATH): NOT DETECTED
Neisseria Gonorrhea: NEGATIVE

## 2017-11-16 ENCOUNTER — Ambulatory Visit
Admission: RE | Admit: 2017-11-16 | Discharge: 2017-11-16 | Disposition: A | Discharge status: Home / Self Care | Payer: BLUE CROSS/BLUE SHIELD | Source: Ambulatory Visit | Attending: Obstetrics and Gynecology | Admitting: Obstetrics and Gynecology

## 2017-11-16 DIAGNOSIS — Z1231 Encounter for screening mammogram for malignant neoplasm of breast: Secondary | ICD-10-CM | POA: Diagnosis present

## 2017-12-20 ENCOUNTER — Emergency Department: Payer: BLUE CROSS/BLUE SHIELD

## 2017-12-20 ENCOUNTER — Other Ambulatory Visit: Payer: Self-pay

## 2017-12-20 ENCOUNTER — Encounter: Payer: Self-pay | Admitting: Emergency Medicine

## 2017-12-20 ENCOUNTER — Emergency Department
Admission: EM | Admit: 2017-12-20 | Discharge: 2017-12-20 | Disposition: A | Discharge status: Home / Self Care | Payer: BLUE CROSS/BLUE SHIELD | Attending: Emergency Medicine | Admitting: Emergency Medicine

## 2017-12-20 DIAGNOSIS — R0602 Shortness of breath: Secondary | ICD-10-CM | POA: Insufficient documentation

## 2017-12-20 DIAGNOSIS — Z79899 Other long term (current) drug therapy: Secondary | ICD-10-CM | POA: Diagnosis not present

## 2017-12-20 DIAGNOSIS — M79604 Pain in right leg: Secondary | ICD-10-CM | POA: Diagnosis present

## 2017-12-20 DIAGNOSIS — Z87891 Personal history of nicotine dependence: Secondary | ICD-10-CM | POA: Diagnosis not present

## 2017-12-20 LAB — COMPREHENSIVE METABOLIC PANEL
ALT: 43 U/L (ref 0–44)
AST: 34 U/L (ref 15–41)
Albumin: 4.2 g/dL (ref 3.5–5.0)
Alkaline Phosphatase: 94 U/L (ref 38–126)
Anion gap: 9 (ref 5–15)
BUN: 11 mg/dL (ref 6–20)
CO2: 28 mmol/L (ref 22–32)
Calcium: 9.6 mg/dL (ref 8.9–10.3)
Chloride: 101 mmol/L (ref 98–111)
Creatinine, Ser: 0.54 mg/dL (ref 0.44–1.00)
GFR calc Af Amer: >60 mL/min (ref >60)
Glucose, Bld: 102 mg/dL — ABNORMAL HIGH (ref 70–99)
Potassium: 3.9 mmol/L (ref 3.5–5.1)
Sodium: 138 mmol/L (ref 135–145)
Total Bilirubin: 0.4 mg/dL (ref 0.3–1.2)
Total Protein: 7.6 g/dL (ref 6.5–8.1)

## 2017-12-20 LAB — CBC WITH DIFFERENTIAL/PLATELET
Abs Immature Granulocytes: 0.2 10*3/uL — ABNORMAL HIGH (ref 0.00–0.07)
BASOS PCT: 0 %
Basophils Absolute: 0 10*3/uL (ref 0.0–0.1)
Eosinophils Absolute: 0.1 10*3/uL (ref 0.0–0.5)
Eosinophils Relative: 1 %
HCT: 37.4 % (ref 36.0–46.0)
Hemoglobin: 11.8 g/dL — ABNORMAL LOW (ref 12.0–15.0)
Immature Granulocytes: 2 %
Lymphocytes Relative: 15 %
Lymphs Abs: 1.7 10*3/uL (ref 0.7–4.0)
MCH: 30.4 pg (ref 26.0–34.0)
MCHC: 31.6 g/dL (ref 30.0–36.0)
MCV: 96.4 fL (ref 80.0–100.0)
Monocytes Absolute: 1 10*3/uL (ref 0.1–1.0)
Monocytes Relative: 9 %
Neutro Abs: 8.2 10*3/uL — ABNORMAL HIGH (ref 1.7–7.7)
Neutrophils Relative %: 73 %
PLATELETS: 400 10*3/uL (ref 150–400)
RBC: 3.88 MIL/uL (ref 3.87–5.11)
RDW: 13.6 % (ref 11.5–15.5)
WBC: 11.2 10*3/uL — ABNORMAL HIGH (ref 4.0–10.5)
nRBC: 0 % (ref 0.0–0.2)

## 2017-12-20 NOTE — ED Triage Notes (Signed)
Pt had hip surgery 11/21 and has developed right calf pain/swelling. Pt states she contacted her PCP who instructed her to come into the ED for evaluation. Pt also reports exertional shortness of breath. Pt in NAD in triage

## 2017-12-20 NOTE — ED Provider Notes (Signed)
[facility] Emergency Department Provider Note  ____________________________________________   I have reviewed the triage vital signs and the nursing notes.   HISTORY  Chief Complaint Leg Pain   History limited by: Not Limited   HPI Brenda Stuart is a 47 y.o. female who presents to the emergency department today because of concern for right leg swelling and pain. She states that the calf pain started three days ago. She has also noted some swelling to the right lower leg. She recently underwent right hip replacement roughly 10 days ago. The patient states she has also had some shortness of breath. She denies any pain in her chest. Has had some cough and felt like she was coming down with a cold. Additionally she has been having some fatigue. States she has been under stress since the surgery because her father has been in the ICU.    Per medical record review patient has a history of depression, CRPS.   Past Medical History:  Diagnosis Date  . Abnormal Pap smear of cervix   . Anxiety   . Complex regional pain syndrome   . Depression   . Headache    MIGRAINES   . 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   Past Surgical History:  Procedure Laterality Date  . ABDOMINAL SURGERY    . APPENDECTOMY    . AUGMENTATION MAMMAPLASTY Bilateral 2000  . BREAST ENHANCEMENT SURGERY    . CESAREAN SECTION    . CRYO INTERCOSTAL NERVE BLOCK     Ganglion, Lumbar  . CRYOABLATION    . HIP SURGERY    . HIP SURGERY  12/2013, 12/2014  . HYSTEROSCOPY W/D&C  12/22/2014   Procedure: DILATATION AND CURETTAGE /HYSTEROSCOPY with cervical polypectomy;  Surgeon: Vena Austria, MD;  Location: [facility] ORS;  Service: Gynecology;;  . TUBAL LIGATION      Prior to Admission medications   Medication Sig Start Date End Date Taking? Authorizing Provider  gabapentin (NEURONTIN) 800 MG tablet Take 1,800 mg by mouth daily.     [provider]  GRALISE 600 MG TABS TAKE 3 TABLETS (1,800 MG TOTAL) BY MOUTH ONCE DAILY 09/06/17   [provider]  minocycline (MINOCIN,DYNACIN) 100 MG capsule Take by mouth.    [provider]  norethindrone (MICRONOR,CAMILA,ERRIN) 0.35 MG tablet Take 1 tablet (0.35 mg total) by mouth daily. 11/09/17   Vena Austria, MD  oxyCODONE (OXYCONTIN) 10 mg 12 hr tablet Take 10 mg by mouth every 12 (twelve) hours.    [provider]  sertraline (ZOLOFT) 25 MG tablet Take 25 mg by mouth daily.    [provider]  spironolactone (ALDACTONE) 100 MG tablet Take 100 mg by mouth daily.    [provider]  SUMAtriptan (IMITREX) 50 MG tablet Take by mouth.    [provider]  tiZANidine (ZANAFLEX) 2 MG tablet Take by mouth. 09/08/17 09/08/18  [provider]  valACYclovir (VALTREX) 500 MG tablet Take 500 mg by mouth 2 (two) times daily.    [provider]  zolpidem (AMBIEN) 5 MG tablet TAKE 1 TABLET (5 MG TOTAL) BY MOUTH NIGHTLY AS NEEDED FOR SLEEP 09/25/17   [provider]    Allergies Lyrica [pregabalin]  Family History  Problem Relation Age of Onset  . Diabetes Mother   . Hypertension Mother   . Hypertension Father   . Breast cancer Neg Hx     Social History Social History  Tobacco Use  . Smoking status: Former Smoker    Packs/day: 1.00    Types: Cigarettes  . Smokeless tobacco: Never Used  Substance Use Topics  . Alcohol use: Yes    Comment: Occ  . Drug use: No    Review of Systems Constitutional: No fever/chills Eyes: No visual changes. ENT: No sore throat. Cardiovascular: Denies chest pain. Respiratory: Denies shortness of breath. Gastrointestinal: No abdominal pain.  No nausea, no vomiting.  No diarrhea.   Genitourinary: Negative for dysuria. Musculoskeletal: Positive for right hip pain, right calf pain Skin: Negative for rash. Neurological: Negative for headaches, focal weakness or  numbness.  ____________________________________________   PHYSICAL EXAM:  VITAL SIGNS: ED Triage Vitals  Enc Vitals Group     BP 12/20/17 1434 110/79     Pulse Rate 12/20/17 1434 99     Resp 12/20/17 1434 18     Temp 12/20/17 1434 98.3 F (36.8 C)     Temp Source 12/20/17 1434 Oral     SpO2 12/20/17 1434 100 %     Weight 12/20/17 1437 135 lb (61.2 kg)     Height 12/20/17 1437 5\' 8"  (1.727 m)     Head Circumference --      Peak Flow --    Constitutional: Alert and oriented.  Eyes: Conjunctivae are normal.  ENT      Head: Normocephalic and atraumatic.      Nose: No congestion/rhinnorhea.      Mouth/Throat: Mucous membranes are moist.      Neck: No stridor. Hematological/Lymphatic/Immunilogical: No cervical lymphadenopathy. Cardiovascular: Normal rate, regular rhythm.  No murmurs, rubs, or gallops.  Respiratory: Normal respiratory effort without tachypnea nor retractions. Breath sounds are clear and equal bilaterally. No wheezes/rales/rhonchi. Gastrointestinal: Soft and non tender. No rebound. No guarding.  Genitourinary: Deferred Musculoskeletal: Normal range of motion in all extremities. No lower extremity edema. Mild tenderness to palpation of the upper right calf.  Neurologic:  Normal speech and language. No gross focal neurologic deficits are appreciated.  Skin:  Skin is warm, dry and intact. No rash noted. Psychiatric: Mood and affect are normal. Speech and behavior are normal. Patient exhibits appropriate insight and judgment.  ____________________________________________    LABS (pertinent positives/negatives)  cmp wnl except glu 102 CBC wbc 11.2, hgb 11.8, plt 400  ____________________________________________   EKG  I, Phineas SemenGraydon Desera Graffeo, attending physician, personally viewed and interpreted this EKG  EKG Time: 1443 Rate: 89 Rhythm: normal sinus rhythm Axis: normal Intervals: qtc 423 QRS: narrow ST changes: no st elevation Impression: normal  ekg  ____________________________________________    RADIOLOGY  CXR No acute disease  Right leg US No DVT  ____________________________________________   PROCEDURES  Procedures  ____________________________________________   INITIAL IMPRESSION / ASSESSMENT AND PLAN / ED COURSE  Pertinent labs & imaging results that were available during my care of the patient were reviewed by me and considered in my medical decision making (see chart for details).   Patient presented to the emergency department today because of concern for right leg pain and swelling in setting of recent surgery. On exam there is some tenderness to the calf, however no erythema or swelling. Concern for DVT so US was obtained. No obvious DVT. Patient also complaining of some fatigue and shortness of breath. CXR without findings concerning for PNA/PTX. Patient is anemic but appears to be improving since surgery. At this time no clear etiology of pain. Did discuss icing/elevation. Discussed follow up with orthopedic surgeon.    ____________________________________________  FINAL CLINICAL IMPRESSION(S) / ED DIAGNOSES  Final diagnoses:  Right leg pain     Note: This dictation was prepared with Dragon dictation. Any transcriptional errors that result from this process are unintentional     Phineas Semen, MD 12/20/17 1724

## 2017-12-20 NOTE — ED Notes (Signed)
Dr. Derrill KayGoodman at bedside to discuss plan with patient. VSS. Safety maintained. Will monitor.

## 2017-12-20 NOTE — Discharge Instructions (Addendum)
Please seek medical attention for any high fevers, chest pain, shortness of breath, change in behavior, persistent vomiting, bloody stool or any other new or concerning symptoms.  

## 2017-12-20 NOTE — ED Notes (Signed)
Assumed care of patient. Will assess upon return from u/s r/o DVT.

## 2017-12-20 NOTE — ED Notes (Signed)
Patient comfortable at present time. Awaiting u/s results.

## 2018-05-06 ENCOUNTER — Other Ambulatory Visit: Payer: Self-pay

## 2018-05-06 MED ORDER — NORETHINDRONE 0.35 MG PO TABS: 1.0000 | ORAL_TABLET | Freq: Every day | ORAL | 2 refills | Status: DC

## 2018-05-10 ENCOUNTER — Other Ambulatory Visit: Payer: Self-pay

## 2018-05-10 MED ORDER — NORETHINDRONE 0.35 MG PO TABS: 1.0000 | ORAL_TABLET | Freq: Every day | ORAL | 2 refills | Status: DC

## 2018-11-11 ENCOUNTER — Ambulatory Visit: Payer: BLUE CROSS/BLUE SHIELD | Admitting: Obstetrics and Gynecology

## 2018-11-17 ENCOUNTER — Ambulatory Visit: Payer: BLUE CROSS/BLUE SHIELD | Admitting: Obstetrics and Gynecology

## 2018-11-29 ENCOUNTER — Encounter: Payer: Self-pay | Admitting: Obstetrics and Gynecology

## 2018-11-29 ENCOUNTER — Ambulatory Visit (INDEPENDENT_AMBULATORY_CARE_PROVIDER_SITE_OTHER): Payer: Medicaid Other | Admitting: Obstetrics and Gynecology

## 2018-11-29 ENCOUNTER — Other Ambulatory Visit: Payer: Self-pay

## 2018-11-29 ENCOUNTER — Other Ambulatory Visit (HOSPITAL_COMMUNITY)
Admission: RE | Admit: 2018-11-29 | Discharge: 2018-11-29 | Disposition: A | Discharge status: Home / Self Care | Payer: Medicaid Other | Source: Ambulatory Visit | Attending: Obstetrics and Gynecology | Admitting: Obstetrics and Gynecology

## 2018-11-29 VITALS — BP 102/76 | HR 93 | Ht 68.0 in | Wt 138.0 lb

## 2018-11-29 DIAGNOSIS — Z113 Encounter for screening for infections with a predominantly sexual mode of transmission: Secondary | ICD-10-CM

## 2018-11-29 DIAGNOSIS — Z124 Encounter for screening for malignant neoplasm of cervix: Secondary | ICD-10-CM

## 2018-11-29 DIAGNOSIS — Z01419 Encounter for gynecological examination (general) (routine) without abnormal findings: Secondary | ICD-10-CM | POA: Insufficient documentation

## 2018-11-29 DIAGNOSIS — Z1211 Encounter for screening for malignant neoplasm of colon: Secondary | ICD-10-CM

## 2018-11-29 DIAGNOSIS — Z0000 Encounter for general adult medical examination without abnormal findings: Secondary | ICD-10-CM | POA: Diagnosis not present

## 2018-11-29 DIAGNOSIS — N951 Menopausal and female climacteric states: Secondary | ICD-10-CM

## 2018-11-29 DIAGNOSIS — Z1239 Encounter for other screening for malignant neoplasm of breast: Secondary | ICD-10-CM

## 2018-11-29 DIAGNOSIS — Z3041 Encounter for surveillance of contraceptive pills: Secondary | ICD-10-CM

## 2018-11-29 NOTE — Progress Notes (Signed)
   Gynecology Annual Exam  PCP: [place]  Chief Complaint:  Chief Complaint  Patient presents with  . Gynecologic Exam    discuss scheduling mammogram, no job/low income    History of Present Illness:Patient is a 48 y.o. G3P2012 presents for annual exam. The patient has no complaints today.   LMP: No LMP recorded. (Menstrual status: Oral contraceptives). No menses in the past year.  The patient is sexually active. She denies dyspareunia.  The patient does perform self breast exams.  There is no notable family history of breast or ovarian cancer in her family.  The patient wears seatbelts: yes.   The patient has regular exercise: not asked.    The patient reports current symptoms of depression.     Review of Systems: Review of Systems  Constitutional: Positive for malaise/fatigue. Negative for chills, diaphoresis, fever and weight loss.  HENT: Positive for hearing loss. Negative for ear pain.   Eyes: Negative.   Respiratory: Negative.   Cardiovascular: Negative.   Gastrointestinal: Positive for constipation. Negative for abdominal pain, blood in stool, diarrhea, heartburn, melena, nausea and vomiting.  Genitourinary: Negative.   Musculoskeletal: Positive for back pain, joint pain and myalgias. Negative for falls and neck pain.  Neurological: Positive for dizziness, tingling and headaches.  Psychiatric/Behavioral: Positive for depression. Negative for hallucinations, substance abuse and suicidal ideas. The patient is nervous/anxious.     Past Medical History:  Past Medical History:  Diagnosis Date  . Abnormal Pap smear of cervix   . Anxiety   . Complex regional pain syndrome   . Depression   . Headache    MIGRAINES   . 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 ABDOMINAL SURGERY    . APPENDECTOMY    . AUGMENTATION MAMMAPLASTY Bilateral 2000  . BREAST ENHANCEMENT SURGERY    .  CESAREAN SECTION    . CRYO INTERCOSTAL NERVE BLOCK     Ganglion, Lumbar  . CRYOABLATION    . HIP SURGERY    . HIP SURGERY  12/2013, 12/2014  . HYSTEROSCOPY W/D&C  12/22/2014   Procedure: DILATATION AND CURETTAGE /HYSTEROSCOPY with cervical polypectomy;  Surgeon: Andreas Staebler, MD;  Location: [facility] ORS;  Service: Gynecology;;  . Spinal cord stimulator    . TUBAL LIGATION      Gynecologic History:  No LMP recorded. (Menstrual status: Oral contraceptives). Last Pap: Results were: 11/09/2017 NIL and HR HPV negative  Last mammogram: 01/28/2016 Results were: BI-RAD I  Obstetric History: G3P2012  Family History:  Family History  Problem Relation Age of Onset  . Diabetes Mother   . Hypertension Mother   . Hypertension Father   . Breast cancer Neg Hx     Social History:  Social History   Socioeconomic History  . Marital status: Married    Spouse name: Not on file  . Number of children: Not on file  . Years of education: Not on file  . Highest education level: Not on file  Occupational History  . Not on file  Social Needs  . Financial resource strain: Not on file  . Food insecurity    Worry: Not on file    Inability: Not on file  . Transportation needs    Medical: Not on file    Non-medical: Not on file  Tobacco Use  . Smoking status: Former Smoker    Packs/day: 1.00    Types: Cigarettes  . Smokeless   tobacco: Never Used  Substance and Sexual Activity  . Alcohol use: Yes    Comment: Occ  . Drug use: No  . Sexual activity: Yes    Birth control/protection: Pill  Lifestyle  . Physical activity    Days per week: Not on file    Minutes per session: Not on file  . Stress: Not on file  Relationships  . Social connections    Talks on phone: Not on file    Gets together: Not on file    Attends religious service: Not on file    Active member of club or organization: Not on file    Attends meetings of clubs or organizations: Not on file    Relationship status: Not on  file  . Intimate partner violence    Fear of current or ex partner: Not on file    Emotionally abused: Not on file    Physically abused: Not on file    Forced sexual activity: Not on file  Other Topics Concern  . Not on file  Social History Narrative  . Not on file    Allergies:  Allergies  Allergen Reactions  . Lyrica [Pregabalin] Hives    Medications: Prior to Admission medications   Medication Sig Start Date End Date Taking? Authorizing Provider  amphetamine-dextroamphetamine (ADDERALL XR) 20 MG 24 hr capsule Take by mouth. 10/05/18  Yes [provider]  aspirin EC 81 MG tablet Take by mouth. 11/24/18 11/24/19 Yes [provider]  cloNIDine (CATAPRES) 0.1 MG tablet TAKE 1 Tablet BY MOUTH ONCE NIGHTLY 08/23/18  Yes [provider]  gabapentin (NEURONTIN) 600 MG tablet Take by mouth. 11/26/18  Yes [provider]  hydrOXYzine (ATARAX/VISTARIL) 25 MG tablet TAKE 1 TABLET BY MOUTH 3 (THREE) TIMES DAILY AS NEEDED FOR ANXIETY (OR PANIC) FOR UP TO 30 DAYS 09/09/18  Yes [provider]  mirtazapine (REMERON) 15 MG tablet TAKE 1/2 Tablet BY MOUTH ONCE NIGHTLY 08/23/18  Yes [provider]  morphine (MS CONTIN) 15 MG 12 hr tablet Take by mouth. 10/29/18  Yes [provider]  naloxone (NARCAN) 4 MG/0.1ML LIQD nasal spray kit Place into the nose. 10/29/18  Yes [provider]  naproxen (EC NAPROSYN) 500 MG EC tablet Take by mouth. 11/24/18 11/24/19 Yes [provider]  norethindrone (MICRONOR) 0.35 MG tablet Take 1 tablet (0.35 mg total) by mouth daily. 05/10/18  Yes Staebler, Andreas, MD  oxyCODONE (OXYCONTIN) 10 mg 12 hr tablet Take 10 mg by mouth every 12 (twelve) hours.   Yes [provider]  sertraline (ZOLOFT) 100 MG tablet Take 200 mg by mouth daily. 08/23/18  Yes [provider]  spironolactone (ALDACTONE) 100 MG tablet Take 100 mg by mouth daily.   Yes [provider]  tiZANidine (ZANAFLEX) 2  MG tablet TAKE 1 TABLET (2 MG TOTAL) BY MOUTH 2 (TWO) TIMES DAILY AS NEEDED 10/28/18  Yes [provider]  traZODone (DESYREL) 100 MG tablet TAKE 1 TABLET BY MOUTH EVERY DAY AT NIGHT 08/26/18  Yes [provider]  valACYclovir (VALTREX) 500 MG tablet Take 500 mg by mouth 2 (two) times daily.   Yes [provider]  minocycline (MINOCIN,DYNACIN) 100 MG capsule Take by mouth.    [provider]    Physical Exam Vitals: Blood pressure 102/76, pulse 93, height 5' 8" (1.727 m), weight 138 lb (62.6 kg).  General: NAD HEENT: normocephalic, anicteric Thyroid: no enlargement, no palpable nodules Pulmonary: No increased work of breathing, CTAB Cardiovascular: RRR,   distal pulses 2+ Breast: Breast symmetrical, no tenderness, no palpable nodules or masses, no skin or nipple retraction present, no nipple discharge.  No axillary or supraclavicular lymphadenopathy. Abdomen: NABS, soft, non-tender, non-distended.  Umbilicus without lesions.  No hepatomegaly, splenomegaly or masses palpable. No evidence of hernia  Genitourinary:  External: Normal external female genitalia.  Normal urethral meatus, normal Bartholin's and Skene's glands.    Vagina: Normal vaginal mucosa, no evidence of prolapse.    Cervix: Grossly normal in appearance, no bleeding  Uterus: Non-enlarged, mobile, normal contour.  No CMT  Adnexa: ovaries non-enlarged, no adnexal masses  Rectal: deferred  Lymphatic: no evidence of inguinal lymphadenopathy Extremities: no edema, erythema, or tenderness Neurologic: Grossly intact Psychiatric: mood appropriate, affect full  Female chaperone present for pelvic and breast  portions of the physical exam     Assessment: 48 y.o. G3P2012 routine annual exam  Plan: Problem List Items Addressed This Visit    None    Visit Diagnoses    Encounter for gynecological examination without abnormal finding    -  Primary   Relevant Orders   Cytology - PAP   HEP, RPR,  HIV Panel   Screening for malignant neoplasm of cervix       Relevant Orders   Cytology - PAP   Breast screening       Vasomotor symptoms due to menopause       Relevant Orders   Day 3 Follicle stimulating hormone   Day 3 Estradiol   Oral contraceptive pill surveillance       Colon cancer screening       Relevant Orders   Ambulatory referral to Gastroenterology   Routine screening for STI (sexually transmitted infection)       Relevant Orders   Cytology - PAP   HEP, RPR, HIV Panel      1) Mammogram - recommend yearly screening mammogram.  Mammogram Was ordered today  2) STI screening  wasoffered and accepted  3) ASCCP guidelines and rational discussed.  Patient opts for yearly screening interval  4) Osteoporosis  - per USPTF routine screening DEXA at age 65  5) Routine healthcare maintenance including cholesterol, diabetes screening discussed managed by PCP  6) Colonoscopy- referral to GI  7) Return in about 1 year (around 11/29/2019) for annual.    Andreas Staebler, MD [facility], [facility] 11/29/2018, 3:18 PM            

## 2018-11-29 NOTE — Patient Instructions (Signed)
[facility] [address] [place] [address]  [facility] [place]  [address] [place] [address]  Phone: [phone]  

## 2018-11-30 LAB — HEP, RPR, HIV PANEL
HIV Screen 4th Generation wRfx: NONREACTIVE
Hepatitis B Surface Ag: NEGATIVE
RPR Ser Ql: NONREACTIVE

## 2018-11-30 LAB — ESTRADIOL: Estradiol: <5 pg/mL

## 2018-11-30 LAB — FOLLICLE STIMULATING HORMONE: FSH: 98.2 m[IU]/mL

## 2018-12-01 ENCOUNTER — Other Ambulatory Visit: Payer: Self-pay | Admitting: Obstetrics and Gynecology

## 2018-12-01 DIAGNOSIS — Z1231 Encounter for screening mammogram for malignant neoplasm of breast: Secondary | ICD-10-CM

## 2018-12-01 LAB — CYTOLOGY - PAP
Comment: NEGATIVE
Diagnosis: NEGATIVE
High risk HPV: NEGATIVE

## 2018-12-01 NOTE — Telephone Encounter (Signed)
I think I sent you and FYI about this yesterday

## 2018-12-01 NOTE — Telephone Encounter (Signed)
Never mind.  I found it

## 2018-12-01 NOTE — Telephone Encounter (Signed)
Where do I find the [facility] referral

## 2018-12-06 ENCOUNTER — Other Ambulatory Visit: Payer: Self-pay | Admitting: Obstetrics and Gynecology

## 2018-12-06 DIAGNOSIS — Z1211 Encounter for screening for malignant neoplasm of colon: Secondary | ICD-10-CM

## 2018-12-06 NOTE — Telephone Encounter (Signed)
Placed.

## 2018-12-10 ENCOUNTER — Encounter: Payer: Self-pay | Admitting: *Deleted

## 2019-01-10 ENCOUNTER — Telehealth: Payer: Self-pay

## 2019-01-11 NOTE — Progress Notes (Signed)
Prescreened patient for BCCCP eligibility due to COVID 19 precautions. Patient to arrive at [facility] at 2:30 on 01/12/19 for screening mammogram.  Orders in.

## 2019-01-12 ENCOUNTER — Ambulatory Visit: Payer: Self-pay | Attending: Oncology

## 2019-01-12 ENCOUNTER — Ambulatory Visit
Admission: RE | Admit: 2019-01-12 | Discharge: 2019-01-12 | Disposition: A | Discharge status: Home / Self Care | Payer: Self-pay | Source: Ambulatory Visit | Attending: Oncology | Admitting: Oncology

## 2019-01-12 ENCOUNTER — Other Ambulatory Visit: Payer: Self-pay

## 2019-01-12 DIAGNOSIS — Z0000 Encounter for general adult medical examination without abnormal findings: Secondary | ICD-10-CM

## 2019-01-25 NOTE — Progress Notes (Unsigned)
Letter mailed from [facility] to notify of normal mammogram results.  Patient to return in one year for annual screening.  Copy to [facility]. 

## 2020-07-03 ENCOUNTER — Other Ambulatory Visit: Payer: Self-pay | Admitting: Obstetrics and Gynecology

## 2020-07-11 ENCOUNTER — Other Ambulatory Visit (HOSPITAL_COMMUNITY)
Admission: RE | Admit: 2020-07-11 | Discharge: 2020-07-11 | Disposition: A | Discharge status: Home / Self Care | Payer: Medicaid Other | Source: Ambulatory Visit | Attending: Obstetrics and Gynecology | Admitting: Obstetrics and Gynecology

## 2020-07-11 ENCOUNTER — Encounter: Payer: Self-pay | Admitting: Obstetrics and Gynecology

## 2020-07-11 ENCOUNTER — Other Ambulatory Visit: Payer: Self-pay

## 2020-07-11 ENCOUNTER — Ambulatory Visit (INDEPENDENT_AMBULATORY_CARE_PROVIDER_SITE_OTHER): Payer: Medicare Other | Admitting: Obstetrics and Gynecology

## 2020-07-11 VITALS — BP 102/66 | HR 76 | Ht 68.0 in | Wt 134.0 lb

## 2020-07-11 DIAGNOSIS — N9419 Other specified dyspareunia: Secondary | ICD-10-CM

## 2020-07-11 DIAGNOSIS — Z01419 Encounter for gynecological examination (general) (routine) without abnormal findings: Secondary | ICD-10-CM

## 2020-07-11 DIAGNOSIS — Z1231 Encounter for screening mammogram for malignant neoplasm of breast: Secondary | ICD-10-CM

## 2020-07-11 DIAGNOSIS — Z1239 Encounter for other screening for malignant neoplasm of breast: Secondary | ICD-10-CM

## 2020-07-11 DIAGNOSIS — N951 Menopausal and female climacteric states: Secondary | ICD-10-CM

## 2020-07-11 DIAGNOSIS — Z124 Encounter for screening for malignant neoplasm of cervix: Secondary | ICD-10-CM

## 2020-07-11 DIAGNOSIS — Z1211 Encounter for screening for malignant neoplasm of colon: Secondary | ICD-10-CM

## 2020-07-11 MED ORDER — OSPHENA 60 MG PO TABS: 1.0000 | ORAL_TABLET | Freq: Every day | ORAL | 11 refills | Status: DC

## 2020-07-11 NOTE — Progress Notes (Signed)
Gynecology Annual Exam  PCP: Malachy Mood, MD  Chief Complaint: No chief complaint on file.   History of Present Illness:Patient is a 50 y.o. [id number] presents for annual exam. The patient has no complaints today.   LMP: No LMP recorded (lmp unknown). Patient is postmenopausal. No menses since stopping norethindrone with prior hormone levels also consistent with menopause.  The patient is sexually active. She admits to dyspareunia.  Did not some improvement with premarin.  The patient does perform self breast exams.  There is no notable family history of breast or ovarian cancer in her family.  The patient wears seatbelts: yes.   The patient has regular exercise: not asked.    The patient denies current symptoms of depression.     Review of Systems: Review of Systems  Constitutional:  Negative for chills and fever.  HENT:  Negative for congestion.   Respiratory:  Negative for cough and shortness of breath.   Cardiovascular:  Negative for chest pain and palpitations.  Gastrointestinal:  Negative for abdominal pain, constipation, diarrhea, heartburn, nausea and vomiting.  Genitourinary:  Negative for dysuria, frequency and urgency.  Musculoskeletal:  Positive for back pain and joint pain.  Skin:  Negative for itching and rash.  Neurological:  Negative for dizziness and headaches.  Endo/Heme/Allergies:  Negative for polydipsia.  Psychiatric/Behavioral:  Negative for depression.    Past Medical History:  There are no problems to display for this patient.   Past Surgical History:  Past Surgical History:  Procedure Laterality Date   ABDOMINAL SURGERY     APPENDECTOMY     AUGMENTATION MAMMAPLASTY Bilateral 2000   BREAST ENHANCEMENT SURGERY     CESAREAN SECTION     CRYO INTERCOSTAL NERVE BLOCK     Ganglion, Lumbar   CRYOABLATION     HIP SURGERY     HIP SURGERY  12/2013, 12/2014   HYSTEROSCOPY WITH D & C  12/22/2014   Procedure: DILATATION AND CURETTAGE /HYSTEROSCOPY  with cervical polypectomy;  Surgeon: Malachy Mood, MD;  Location: [facility] ORS;  Service: Gynecology;;   Spinal cord stimulator     TUBAL LIGATION      Gynecologic History:  No LMP recorded. (Menstrual status: Oral contraceptives). Last Pap: Results were: 11/09/2020NIL and HR HPV negative  Last mammogram: 01/12/2019 Results were: Gillian Shields I  Obstetric History: [id number]  Family History:  Family History  Problem Relation Age of Onset   Diabetes Mother    Hypertension Mother    Hypertension Father    Breast cancer Neg Hx     Social History:  Social History   Socioeconomic History   Marital status: Divorced    Spouse name: Not on file   Number of children: Not on file   Years of education: Not on file   Highest education level: Not on file  Occupational History   Not on file  Tobacco Use   Smoking status: Former    Packs/day: 1.00    Pack years: 0.00    Types: Cigarettes   Smokeless tobacco: Never  Vaping Use   Vaping Use: Never used  Substance and Sexual Activity   Alcohol use: Yes    Comment: Occ   Drug use: No   Sexual activity: Yes    Birth control/protection: Pill  Other Topics Concern   Not on file  Social History Narrative   Not on file   Social Determinants of Health   Financial Resource Strain: Not on file  Food Insecurity: Not on file  Transportation Needs: Not on file  Physical Activity: Not on file  Stress: Not on file  Social Connections: Not on file  Intimate Partner Violence: Not on file    Allergies:  Allergies  Allergen Reactions   Lyrica [Pregabalin] Hives    Medications: Prior to Admission medications   Medication Sig Start Date End Date Taking? Authorizing Provider  amphetamine-dextroamphetamine (ADDERALL XR) 20 MG 24 hr capsule Take by mouth. 10/05/18   [provider]  cloNIDine (CATAPRES) 0.1 MG tablet TAKE 1 Tablet BY MOUTH ONCE NIGHTLY 08/23/18   [provider]  gabapentin (NEURONTIN) 600 MG tablet Take by  mouth. 11/26/18   [provider]  hydrOXYzine (ATARAX/VISTARIL) 25 MG tablet TAKE 1 TABLET BY MOUTH 3 (THREE) TIMES DAILY AS NEEDED FOR ANXIETY (OR PANIC) FOR UP TO 30 DAYS 09/09/18   [provider]  minocycline (MINOCIN,DYNACIN) 100 MG capsule Take by mouth.    [provider]  mirtazapine (REMERON) 15 MG tablet TAKE 1/2 Tablet BY MOUTH ONCE NIGHTLY 08/23/18   [provider]  morphine (MS CONTIN) 15 MG 12 hr tablet Take by mouth. 10/29/18   [provider]  naloxone Karma Greaser) 4 MG/0.1ML LIQD nasal spray kit Place into the nose. 10/29/18   [provider]  norethindrone (MICRONOR) 0.35 MG tablet Take 1 tablet (0.35 mg total) by mouth daily. 05/10/18   Malachy Mood, MD  oxyCODONE (OXYCONTIN) 10 mg 12 hr tablet Take 10 mg by mouth every 12 (twelve) hours.    [provider]  sertraline (ZOLOFT) 100 MG tablet Take 200 mg by mouth daily. 08/23/18   [provider]  spironolactone (ALDACTONE) 100 MG tablet Take 100 mg by mouth daily.    [provider]  tiZANidine (ZANAFLEX) 2 MG tablet TAKE 1 TABLET (2 MG TOTAL) BY MOUTH 2 (TWO) TIMES DAILY AS NEEDED 10/28/18   [provider]  traZODone (DESYREL) 100 MG tablet TAKE 1 TABLET BY MOUTH EVERY DAY AT NIGHT 08/26/18   [provider]  valACYclovir (VALTREX) 500 MG tablet Take 500 mg by mouth 2 (two) times daily.    [provider]    Physical Exam Vitals: Blood pressure 102/66, pulse 76, height _0  (1.727 m), weight 134 lb (60.8 kg).  General: NAD HEENT: normocephalic, anicteric Thyroid: no enlargement, no palpable nodules Pulmonary: No increased work of breathing, CTAB Cardiovascular: RRR, distal pulses 2+ Breast: Breast symmetrical, no tenderness, no palpable nodules or masses, no skin or nipple retraction present, no nipple discharge, bilateral implants well healed periareolar scars.  No axillary or supraclavicular lymphadenopathy. Abdomen:  NABS, soft, non-tender, non-distended.  Umbilicus without lesions.  No hepatomegaly, splenomegaly or masses palpable. No evidence of hernia  Genitourinary:  External: Normal external female genitalia.  Normal urethral meatus, normal Bartholin's and Skene's glands.    Vagina: Normal vaginal mucosa with some atrophic changes, no evidence of prolapse.    Cervix: Grossly normal in appearance, no bleeding  Uterus: Non-enlarged, mobile, normal contour.  No CMT  Adnexa: ovaries non-enlarged, no adnexal masses  Rectal: deferred  Lymphatic: no evidence of inguinal lymphadenopathy Extremities: no edema, erythema, or tenderness Neurologic: Grossly intact Psychiatric: mood appropriate, affect full  Female chaperone present for pelvic and breast  portions of the physical exam    Assessment: 50 y.o. [id number] routine annual exam  Plan: Problem List Items Addressed This Visit   None Visit Diagnoses     Breast cancer screening by mammogram    -  Primary   Breast  screening       Encounter for gynecological examination without abnormal finding       Dyspareunia due to medical condition in female patient       Vaginal dryness, menopausal       Colon cancer screening       Relevant Orders   Ambulatory referral to Gastroenterology   Screening for malignant neoplasm of cervix       Relevant Orders   Cytology - PAP       1) Mammogram - recommend yearly screening mammogram.  Mammogram Was ordered today  2) STI screening  was notoffered and therefore not obtained  3) ASCCP guidelines and rational discussed.  Patient opts for every 3 years screening interval  4) Osteoporosis  - per USPTF routine screening DEXA at age 57  5) Routine healthcare maintenance including cholesterol, diabetes screening discussed managed by PCP  6) Colonoscopy - GI referral  7) Dyspareunia vaginal dryness - Rx osphenia   6) Return in about 1 year (around 07/11/2021) for annual.    Malachy Mood, MD [facility], [facility] 07/11/2020, 2:50 PM

## 2020-07-11 NOTE — Patient Instructions (Signed)
[facility] [address] [place] [address]  [facility] [place]  [address] [place] [address]  Phone: [phone]  

## 2020-07-13 LAB — CYTOLOGY - PAP
Comment: NEGATIVE
Diagnosis: NEGATIVE
High risk HPV: NEGATIVE

## 2020-07-18 ENCOUNTER — Encounter: Payer: Self-pay | Admitting: *Deleted

## 2020-07-31 ENCOUNTER — Other Ambulatory Visit: Payer: Self-pay | Admitting: Obstetrics and Gynecology

## 2020-07-31 DIAGNOSIS — Z1231 Encounter for screening mammogram for malignant neoplasm of breast: Secondary | ICD-10-CM

## 2020-08-13 ENCOUNTER — Other Ambulatory Visit: Payer: Self-pay | Admitting: Obstetrics and Gynecology

## 2020-08-13 ENCOUNTER — Ambulatory Visit
Admission: RE | Admit: 2020-08-13 | Discharge: 2020-08-13 | Disposition: A | Discharge status: Home / Self Care | Payer: Medicare HMO | Source: Ambulatory Visit | Attending: Obstetrics and Gynecology | Admitting: Obstetrics and Gynecology

## 2020-08-13 ENCOUNTER — Other Ambulatory Visit: Payer: Self-pay

## 2020-08-13 DIAGNOSIS — Z1231 Encounter for screening mammogram for malignant neoplasm of breast: Secondary | ICD-10-CM

## 2020-09-20 ENCOUNTER — Other Ambulatory Visit: Payer: Self-pay | Admitting: Obstetrics and Gynecology

## 2020-09-20 MED ORDER — ESTRADIOL 0.1 MG/GM VA CREA: 1.0000 g | TOPICAL_CREAM | VAGINAL | 3 refills | Status: AC

## 2021-07-04 ENCOUNTER — Telehealth: Payer: Self-pay

## 2021-07-04 NOTE — Telephone Encounter (Signed)
She called to speak with Dr Alvester Morin about a procedure that was done on her mom,

## 2021-12-31 ENCOUNTER — Telehealth: Payer: Self-pay

## 2021-12-31 NOTE — Telephone Encounter (Signed)
LMTRC

## 2021-12-31 NOTE — Telephone Encounter (Signed)
Pt calling; wants to talk with Helmut Muster only about her upcoming appt; says it's personal.  Is having trouble with MyChart - # given for help with it.  [phone]

## 2022-01-03 NOTE — Telephone Encounter (Signed)
Patient is calling to follow up and message left. I advised patient you tried to reach out to patient on 12/12 and left message. Patient states she didn't received a message. I did advised patient that ABC scheduled is she is now seeing patient's in office Monday pm and the All day Tuesday and Thursday's. I verified call back number to patient she agreed. She didn't want to leave message due to personnel at [facility]. I advise patient to be aware on the days I advise ABC would be in office she would be reaching out! Patient expressed understanding

## 2022-01-06 NOTE — Telephone Encounter (Signed)
Pt's daughter used to be my pt Desmond Dike) and wanted to come back but we're not accepting new pts currently. Told her we would call her to schedule annual.  This is Audiological scientist

## 2022-04-27 NOTE — Progress Notes (Unsigned)
PCP: Vena Austria, MD   No chief complaint on file.   HPI:      Ms. Brenda Stuart is a 52 y.o. [id number] whose LMP was No LMP recorded (lmp unknown). Patient is postmenopausal., presents today for her annual examination.  Her menses are {norm/abn:715}, lasting {number: 22536} days.  Dysmenorrhea {dysmen:716}. She {does:18564} have intermenstrual bleeding. She {does:18564} have vasomotor sx.   Sex activity: {sex active: 315163}. She {does:18564} have vaginal dryness.  Last Pap: 07/11/20  Results were: no abnormalities /neg HPV DNA.  Hx of STDs: {STD hx:14358}  Last mammogram: 08/13/20  Results were: normal--routine follow-up in 12 months There is no FH of breast cancer. There is no FH of ovarian cancer. The patient {does:18564} do self-breast exams.  Colonoscopy: {hx:15363}  Repeat due after 10*** years.   Tobacco use: {tob:20664} Alcohol use: {Alcohol:11675} No drug use Exercise: {exercise:31265}  She {does:18564} get adequate calcium and Vitamin D in her diet.  Labs with PCP.   There are no problems to display for this patient.   Past Surgical History:  Procedure Laterality Date   ABDOMINAL SURGERY     APPENDECTOMY     AUGMENTATION MAMMAPLASTY Bilateral 2000   BREAST ENHANCEMENT SURGERY     CESAREAN SECTION     CRYO INTERCOSTAL NERVE BLOCK     Ganglion, Lumbar   CRYOABLATION     HIP SURGERY     HIP SURGERY  12/2013, 12/2014   HYSTEROSCOPY WITH D & C  12/22/2014   Procedure: DILATATION AND CURETTAGE /HYSTEROSCOPY with cervical polypectomy;  Surgeon: Vena Austria, MD;  Location: [facility] ORS;  Service: Gynecology;;   Spinal cord stimulator     TUBAL LIGATION      Family History  Problem Relation Age of Onset   Diabetes Mother    Hypertension Mother    Hypertension Father    Breast cancer Neg Hx     Social History   Socioeconomic History   Marital status: Divorced    Spouse name: Not on file   Number of children: Not on file   Years of  education: Not on file   Highest education level: Not on file  Occupational History   Not on file  Tobacco Use   Smoking status: Former    Packs/day: 1    Types: Cigarettes   Smokeless tobacco: Never  Vaping Use   Vaping Use: Never used  Substance and Sexual Activity   Alcohol use: Yes    Comment: Occ   Drug use: No   Sexual activity: Yes    Birth control/protection: Post-menopausal  Other Topics Concern   Not on file  Social History Narrative   Not on file   Social Determinants of Health   Financial Resource Strain: Not on file  Food Insecurity: Not on file  Transportation Needs: Not on file  Physical Activity: Not on file  Stress: Not on file  Social Connections: Not on file  Intimate Partner Violence: Not on file     Current Outpatient Medications:    estradiol (ESTRACE VAGINAL) 0.1 MG/GM vaginal cream, Place 1 g vaginally 3 (three) times a week., Disp: 42.5 g, Rfl: 3   amphetamine-dextroamphetamine (ADDERALL XR) 20 MG 24 hr capsule, Take by mouth., Disp: , Rfl:    buPROPion (WELLBUTRIN XL) 150 MG 24 hr tablet, Take by mouth., Disp: , Rfl:    buPROPion (WELLBUTRIN XL) 300 MG 24 hr tablet, Take 1 tablet by mouth daily., Disp: , Rfl:    cloNIDine (CATAPRES)  0.1 MG tablet, TAKE 1 Tablet BY MOUTH ONCE NIGHTLY, Disp: , Rfl:    cyclobenzaprine (FLEXERIL) 10 MG tablet, Take 1 tablet by mouth 3 (three) times daily as needed., Disp: , Rfl:    gabapentin (NEURONTIN) 600 MG tablet, Take by mouth., Disp: , Rfl:    hydrOXYzine (ATARAX/VISTARIL) 25 MG tablet, TAKE 1 TABLET BY MOUTH 3 (THREE) TIMES DAILY AS NEEDED FOR ANXIETY (OR PANIC) FOR UP TO 30 DAYS, Disp: , Rfl:    mirtazapine (REMERON) 15 MG tablet, TAKE 1/2 Tablet BY MOUTH ONCE NIGHTLY, Disp: , Rfl:    morphine (MS CONTIN) 15 MG 12 hr tablet, Take by mouth., Disp: , Rfl:    naloxone (NARCAN) 4 MG/0.1ML LIQD nasal spray kit, Place into the nose., Disp: , Rfl:    oxyCODONE (OXYCONTIN) 10 mg 12 hr tablet, Take 10 mg by mouth  every 12 (twelve) hours., Disp: , Rfl:    sertraline (ZOLOFT) 100 MG tablet, Take 200 mg by mouth daily., Disp: , Rfl:    spironolactone (ALDACTONE) 100 MG tablet, Take 100 mg by mouth daily., Disp: , Rfl:    tiZANidine (ZANAFLEX) 2 MG tablet, TAKE 1 TABLET (2 MG TOTAL) BY MOUTH 2 (TWO) TIMES DAILY AS NEEDED, Disp: , Rfl:    traZODone (DESYREL) 100 MG tablet, TAKE 1 TABLET BY MOUTH EVERY DAY AT NIGHT, Disp: , Rfl:    valACYclovir (VALTREX) 500 MG tablet, Take 500 mg by mouth 2 (two) times daily., Disp: , Rfl:      ROS:  Review of Systems BREAST: No symptoms    Objective: LMP  (LMP Unknown)    OBGyn Exam  Results: No results found for this or any previous visit (from the past 24 hour(s)).  Assessment/Plan:  No diagnosis found.   No orders of the defined types were placed in this encounter.           GYN counsel {counseling: 16159}    F/U  No follow-ups on file.  Keely Drennan B. Inocente Krach, PA-C 04/27/2022 8:16 PM

## 2022-04-28 ENCOUNTER — Other Ambulatory Visit (HOSPITAL_COMMUNITY)
Admission: RE | Admit: 2022-04-28 | Discharge: 2022-04-28 | Disposition: A | Discharge status: Home / Self Care | Payer: Medicare Other | Source: Ambulatory Visit | Attending: Obstetrics and Gynecology | Admitting: Obstetrics and Gynecology

## 2022-04-28 ENCOUNTER — Encounter: Payer: Self-pay | Admitting: Obstetrics and Gynecology

## 2022-04-28 ENCOUNTER — Ambulatory Visit (INDEPENDENT_AMBULATORY_CARE_PROVIDER_SITE_OTHER): Payer: Medicare Other | Admitting: Obstetrics and Gynecology

## 2022-04-28 VITALS — BP 100/60 | Ht 68.0 in

## 2022-04-28 DIAGNOSIS — Z87410 Personal history of cervical dysplasia: Secondary | ICD-10-CM | POA: Insufficient documentation

## 2022-04-28 DIAGNOSIS — Z1151 Encounter for screening for human papillomavirus (HPV): Secondary | ICD-10-CM

## 2022-04-28 DIAGNOSIS — Z01419 Encounter for gynecological examination (general) (routine) without abnormal findings: Secondary | ICD-10-CM | POA: Insufficient documentation

## 2022-04-28 DIAGNOSIS — Z124 Encounter for screening for malignant neoplasm of cervix: Secondary | ICD-10-CM | POA: Insufficient documentation

## 2022-04-28 DIAGNOSIS — Z1231 Encounter for screening mammogram for malignant neoplasm of breast: Secondary | ICD-10-CM

## 2022-04-28 DIAGNOSIS — N9419 Other specified dyspareunia: Secondary | ICD-10-CM

## 2022-04-28 DIAGNOSIS — Z1211 Encounter for screening for malignant neoplasm of colon: Secondary | ICD-10-CM

## 2022-04-28 MED ORDER — OSPHENA 60 MG PO TABS
1.0000 | ORAL_TABLET | Freq: Every day | ORAL | 3 refills | Status: AC
Start: 2022-04-28 — End: ?

## 2022-04-28 NOTE — Patient Instructions (Addendum)
I value your feedback and you entrusting us with your care. If you get a [facility] patient survey, I would appreciate you taking the time to let us know about your experience today. Thank you!  [facility]: [phone]      

## 2022-05-01 ENCOUNTER — Telehealth: Payer: Self-pay

## 2022-05-07 LAB — CYTOLOGY - PAP
Comment: NEGATIVE
Diagnosis: NEGATIVE
High risk HPV: NEGATIVE

## 2022-05-12 ENCOUNTER — Encounter: Payer: Self-pay | Admitting: Obstetrics and Gynecology

## 2023-02-17 IMAGING — MG DIGITAL SCREENING BREAST BILAT IMPLANT W/ TOMO W/ CAD
8 of 16 series · 8 of 32 positions shown · non-contrast
Comparison: Previous exam(s).

CLINICAL DATA: Screening.

EXAM:
DIGITAL SCREENING BILATERAL MAMMOGRAM WITH IMPLANTS, CAD AND
TOMOSYNTHESIS
TECHNIQUE: Bilateral screening digital craniocaudal and mediolateral oblique
mammograms were obtained. Bilateral screening digital breast
tomosynthesis was performed. The images were evaluated with
computer-aided detection. Standard and/or implant displaced views
were performed.

[R MLO (1 of 2)]
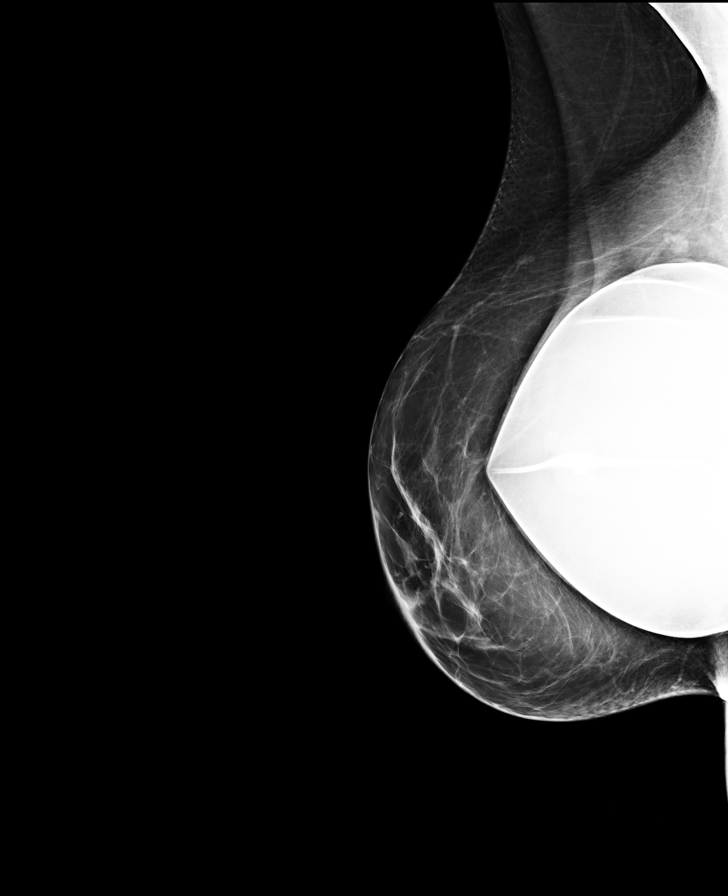

[L MLO (1 of 2)]
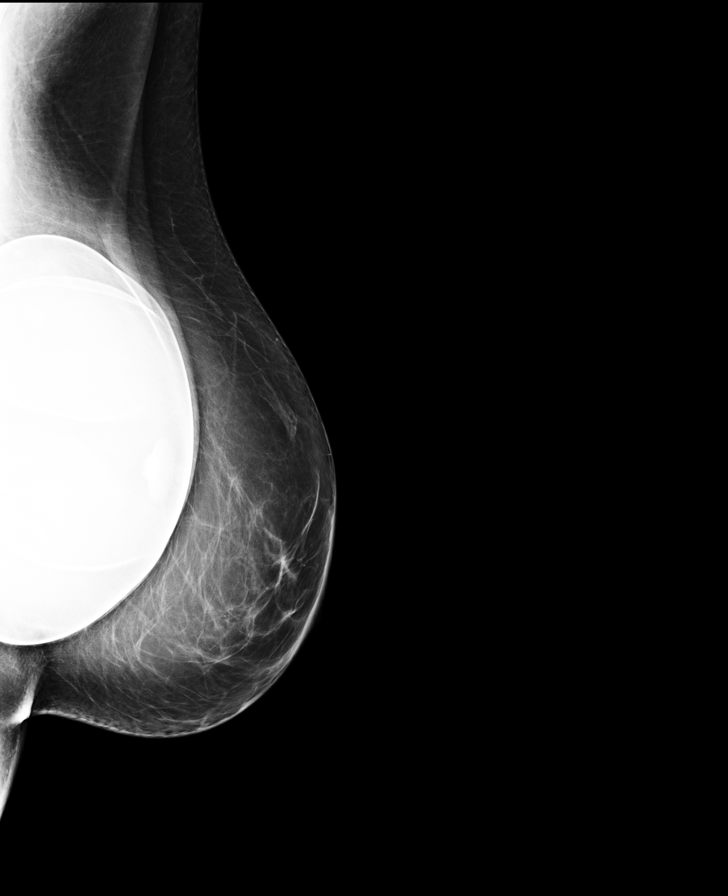

[L CC]
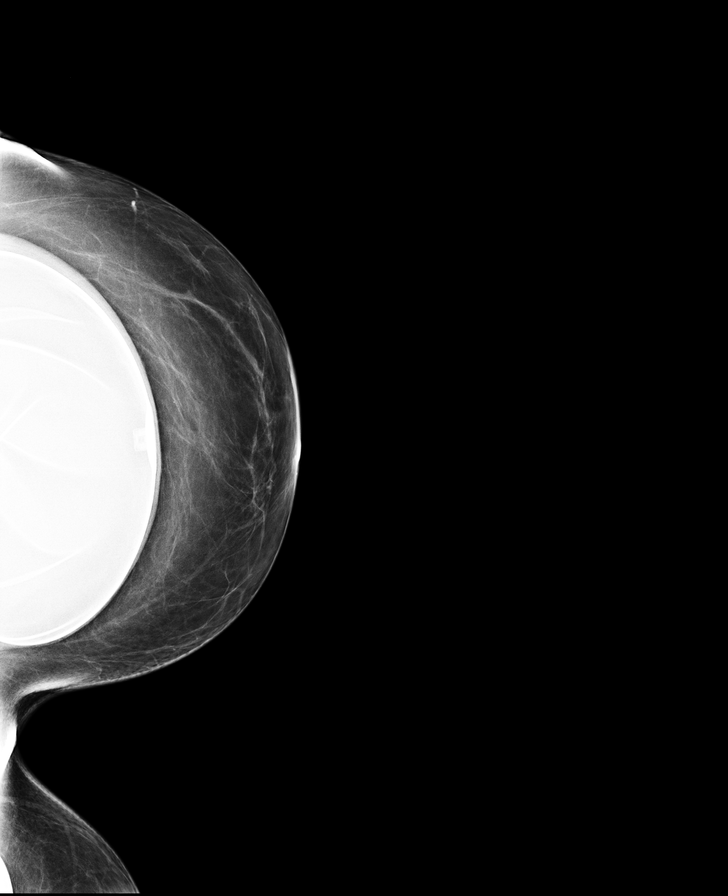

[R CC]
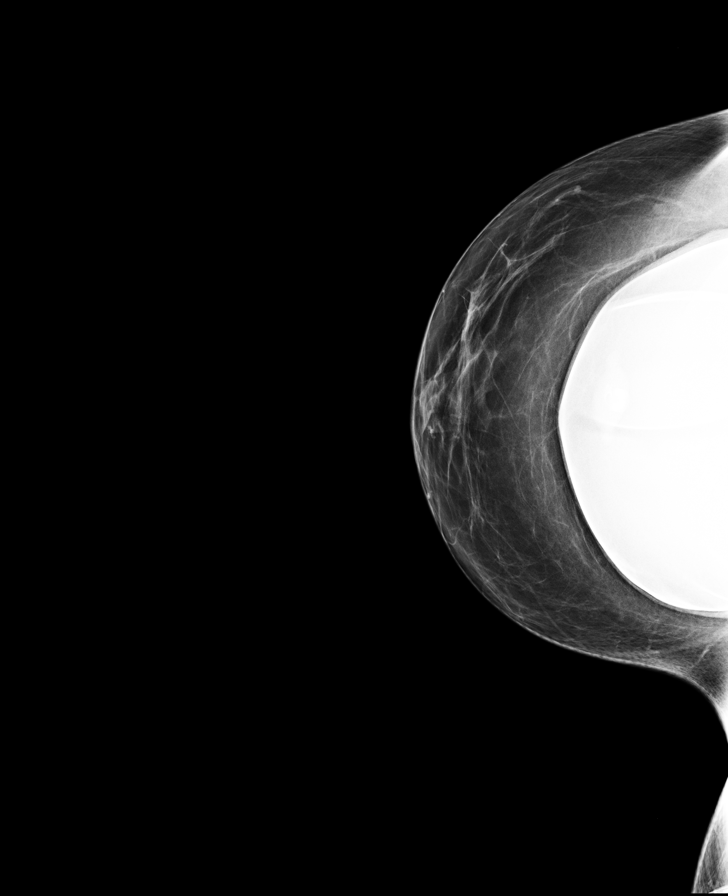

[R CC synth-2D]
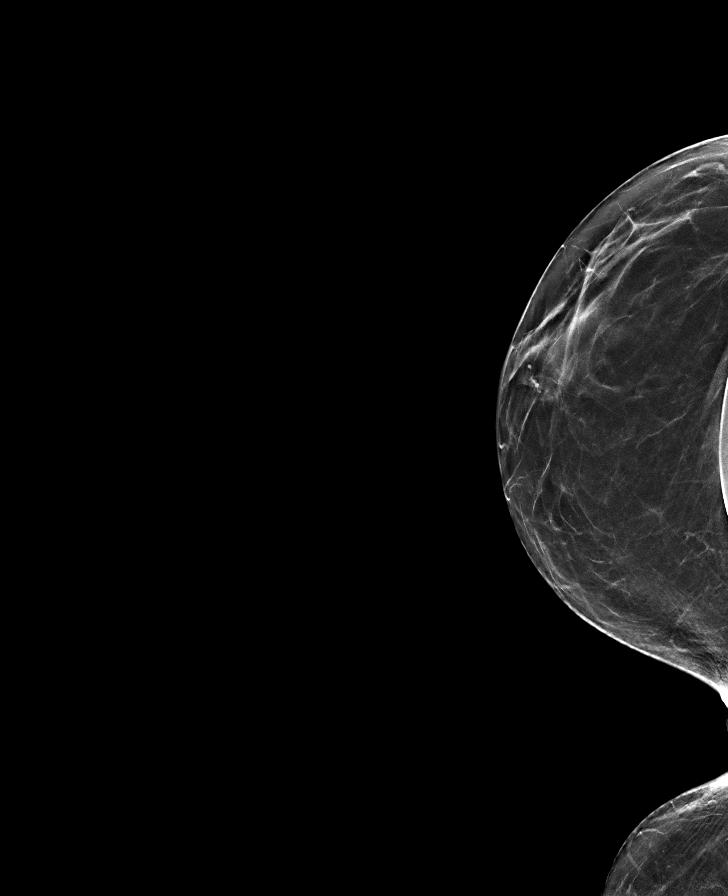

[L MLO (2 of 2)]
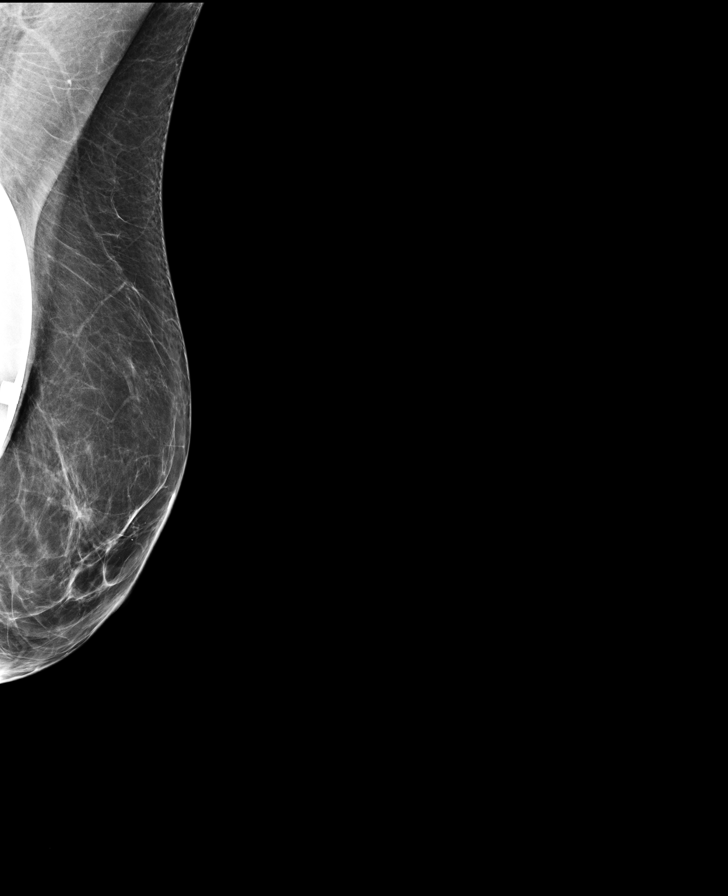

[R MLO (2 of 2)]
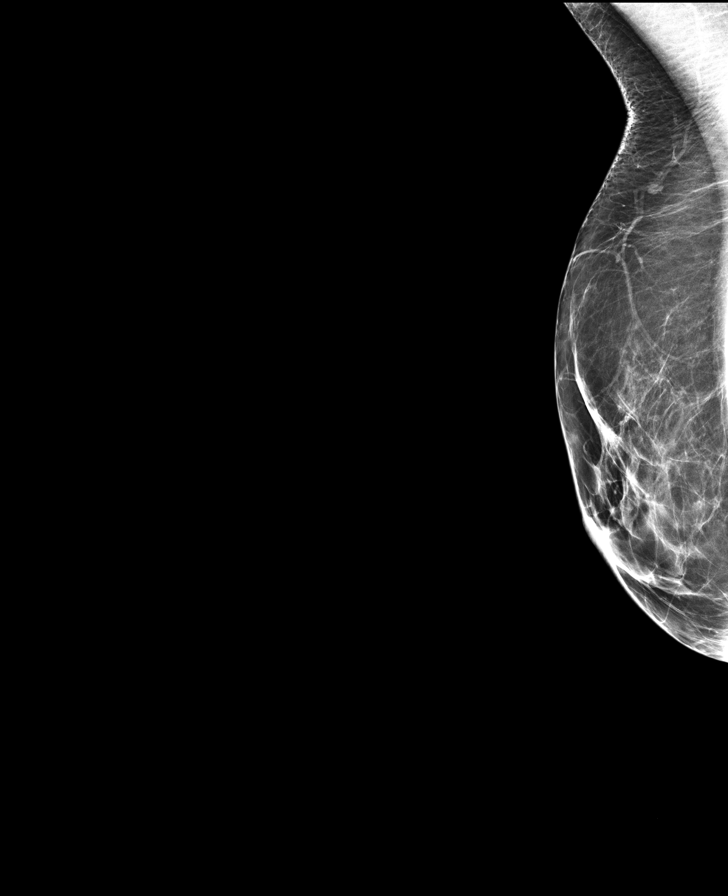

[L CC synth-2D]
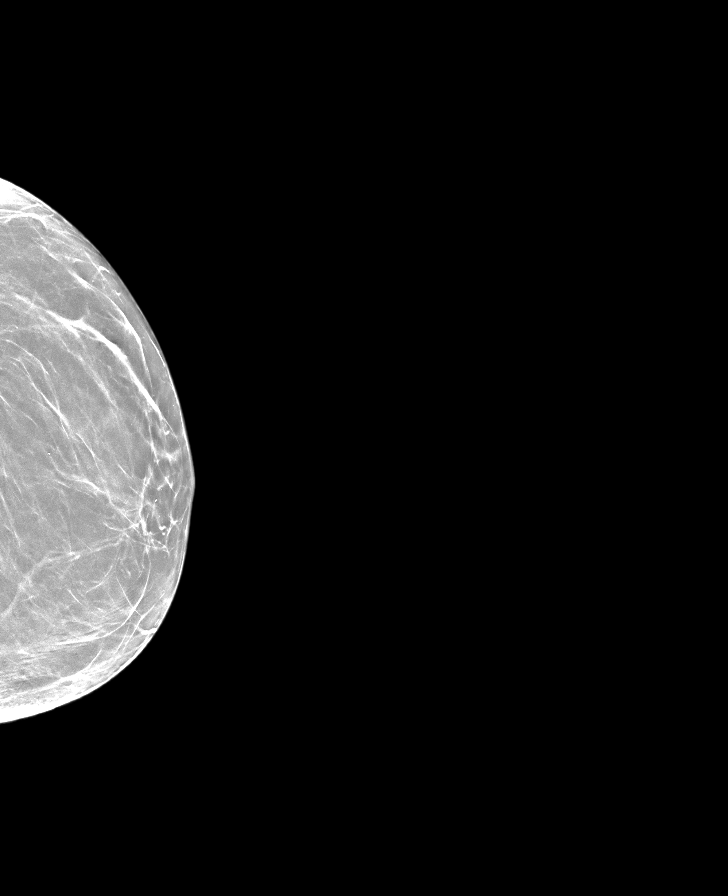

[8 of 32 positions shown; findings below may reference images not displayed]

ACR Breast Density Category b: There are scattered areas of
fibroglandular density.
FINDINGS: There are no findings suspicious for malignancy.
IMPRESSION: No mammographic evidence of malignancy. A result letter of this
screening mammogram will be mailed directly to the patient.

RECOMMENDATION:
Screening mammogram in one year. (Code:NE-Z-IH7)

BI-RADS CATEGORY  1: Negative.
